# Patient Record
Sex: Male | Born: 1955 | Race: White | Hispanic: No | Marital: Single | Smoking: Former smoker
Health system: Southern US, Community
[De-identification: ages and names within clinical notes are randomized; demographics above are authoritative.]

## PROBLEM LIST (undated history)

## (undated) DIAGNOSIS — K4090 Unilateral inguinal hernia, without obstruction or gangrene, not specified as recurrent: Secondary | ICD-10-CM

## (undated) DIAGNOSIS — D696 Thrombocytopenia, unspecified: Secondary | ICD-10-CM

## (undated) DIAGNOSIS — Z8719 Personal history of other diseases of the digestive system: Secondary | ICD-10-CM

## (undated) DIAGNOSIS — F419 Anxiety disorder, unspecified: Secondary | ICD-10-CM

## (undated) DIAGNOSIS — N189 Chronic kidney disease, unspecified: Secondary | ICD-10-CM

## (undated) DIAGNOSIS — R740 Nonspecific elevation of levels of transaminase and lactic acid dehydrogenase [LDH]: Secondary | ICD-10-CM

## (undated) DIAGNOSIS — Z87898 Personal history of other specified conditions: Secondary | ICD-10-CM

## (undated) DIAGNOSIS — G709 Myoneural disorder, unspecified: Secondary | ICD-10-CM

## (undated) DIAGNOSIS — D649 Anemia, unspecified: Secondary | ICD-10-CM

## (undated) DIAGNOSIS — R160 Hepatomegaly, not elsewhere classified: Secondary | ICD-10-CM

## (undated) DIAGNOSIS — I509 Heart failure, unspecified: Secondary | ICD-10-CM

## (undated) DIAGNOSIS — I10 Essential (primary) hypertension: Secondary | ICD-10-CM

## (undated) DIAGNOSIS — A419 Sepsis, unspecified organism: Secondary | ICD-10-CM

## (undated) DIAGNOSIS — J449 Chronic obstructive pulmonary disease, unspecified: Secondary | ICD-10-CM

## (undated) DIAGNOSIS — F1020 Alcohol dependence, uncomplicated: Secondary | ICD-10-CM

## (undated) HISTORY — DX: Thrombocytopenia, unspecified: D69.6

## (undated) HISTORY — DX: Nonspecific elevation of levels of transaminase and lactic acid dehydrogenase (ldh): R74.0

## (undated) HISTORY — DX: Alcohol dependence, uncomplicated: F10.20

## (undated) HISTORY — DX: Unilateral inguinal hernia, without obstruction or gangrene, not specified as recurrent: K40.90

## (undated) HISTORY — PX: OTHER SURGICAL HISTORY: SHX169

## (undated) HISTORY — PX: TONSILLECTOMY: SUR1361

---
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
## 2017-07-08 ENCOUNTER — Ambulatory Visit (INDEPENDENT_AMBULATORY_CARE_PROVIDER_SITE_OTHER): Payer: BLUE CROSS/BLUE SHIELD | Admitting: Physician Assistant

## 2017-07-08 ENCOUNTER — Ambulatory Visit (INDEPENDENT_AMBULATORY_CARE_PROVIDER_SITE_OTHER): Payer: BLUE CROSS/BLUE SHIELD

## 2017-07-08 ENCOUNTER — Encounter: Payer: Self-pay | Admitting: Physician Assistant

## 2017-07-08 VITALS — BP 147/79 | HR 80 | Temp 97.3°F | Resp 16 | Ht 73.5 in | Wt 178.6 lb

## 2017-07-08 DIAGNOSIS — Z136 Encounter for screening for cardiovascular disorders: Secondary | ICD-10-CM

## 2017-07-08 DIAGNOSIS — F1020 Alcohol dependence, uncomplicated: Secondary | ICD-10-CM | POA: Diagnosis not present

## 2017-07-08 DIAGNOSIS — D696 Thrombocytopenia, unspecified: Secondary | ICD-10-CM

## 2017-07-08 DIAGNOSIS — Z0000 Encounter for general adult medical examination without abnormal findings: Secondary | ICD-10-CM | POA: Diagnosis not present

## 2017-07-08 DIAGNOSIS — R7989 Other specified abnormal findings of blood chemistry: Secondary | ICD-10-CM | POA: Diagnosis not present

## 2017-07-08 DIAGNOSIS — R945 Abnormal results of liver function studies: Secondary | ICD-10-CM

## 2017-07-08 NOTE — Patient Instructions (Addendum)
Come back in two weeks.      IF you received an x-ray today, you will receive an invoice from [facility]. Please contact [facility] at [phone] with questions or concerns regarding your invoice.   IF you received labwork today, you will receive an invoice from [place]. Please contact [facility] at [phone] with questions or concerns regarding your invoice.   Our billing staff will not be able to assist you with questions regarding bills from these companies.  You will be contacted with the lab results as soon as they are available. The fastest way to get your results is to activate your My Chart account. Instructions are located on the last page of this paperwork. If you have not heard from [place] regarding the results in 2 weeks, please contact this office.

## 2017-07-08 NOTE — Progress Notes (Signed)
07/23/2017 11:25 AM   DOB: [birth year] / MRN: [id number]  SUBJECTIVE:  Nicholas Cunningham is a 61 y.o. male presenting to establish care.  From [place] and recently moved here. Retired years ago.  Is in need of hernia surgery about 1 month ago. This is in his groin. He quit smoking 4 years ago after a 15 year history.  Has a history of HTN, dyslipidemia and last doses of medication about 1 month ago. Stopped due because his medication was moved from there normal place.  Likes to drink daily, all day and night, off and on for what sounds like his adult life. He does think his drinking is a problem at this time. Has fallen several times due to drinking. Also has no feeling in his hands and feet. He is showing some interest in quitting but has not taken any steps to stop.    Depression screen PHQ 2/9 07/22/2017  Decreased Interest 0  Down, Depressed, Hopeless 0  PHQ - 2 Score 0     He has No Known Allergies.   He  has no past medical history on file.    He  reports that he quit smoking about 5 years ago. He has never used smokeless tobacco. He reports that he drinks alcohol. He reports that he does not use drugs. He  has no sexual activity history on file. The patient  has no past surgical history on file.  His family history is not on file.  Review of Systems  Constitutional: Negative for chills, diaphoresis and fever.  Respiratory: Negative for cough, hemoptysis, sputum production, shortness of breath and wheezing.   Cardiovascular: Negative for chest pain, orthopnea and leg swelling.  Gastrointestinal: Negative for nausea.  Skin: Negative for rash.  Neurological: Negative for dizziness.    The problem list and medications were reviewed and updated by myself where necessary and exist elsewhere in the encounter.   OBJECTIVE:  BP (!) 147/79 (BP Location: Right Arm, Patient Position: Sitting, Cuff Size: Normal)   Pulse 80   Temp (!) 97.3 F (36.3 C) (Oral)   Resp 16   Ht 6' 1.5" (1.867 m)    Wt 178 lb 9.6 oz (81 kg)   SpO2 94%   BMI 23.24 kg/m   Physical Exam  Constitutional: He appears well-developed. He is active and cooperative.  Non-toxic appearance.  Cardiovascular: Normal rate, regular rhythm, S1 normal, S2 normal, normal heart sounds, intact distal pulses and normal pulses.  Exam reveals no gallop and no friction rub.   No murmur heard. Pulmonary/Chest: Effort normal. No stridor. No tachypnea. No respiratory distress. He has no wheezes. He has no rales.  Abdominal: He exhibits no distension.  Musculoskeletal: He exhibits no edema.  Neurological: He is alert.  Skin: Skin is warm and dry. He is not diaphoretic. No pallor.  Psychiatric: His affect is not labile and not inappropriate. His speech is delayed and slurred. His speech is not tangential. He is slowed and withdrawn. He is not actively hallucinating. Thought content is not paranoid and not delusional. Cognition and memory are impaired. He exhibits a depressed mood. He expresses no homicidal and no suicidal ideation. He expresses no suicidal plans and no homicidal plans. He exhibits normal recent memory and normal remote memory.  Vitals reviewed.    Lab Results  Component Value Date   WBC 4.5 07/08/2017   HGB 16.0 07/08/2017   HCT 46.3 07/08/2017   MCV 91 07/08/2017   PLT 118 (L) 07/08/2017  Lab Results  Component Value Date   CREATININE 0.82 07/22/2017   BUN 11 07/22/2017   NA 145 (H) 07/22/2017   K 3.5 07/22/2017   CL 97 07/22/2017   CO2 25 07/22/2017    Lab Results  Component Value Date   ALT 114 (H) 07/08/2017   AST 167 (H) 07/08/2017   ALKPHOS 61 07/08/2017   BILITOT 1.1 07/08/2017    No results found for: TSH  Lab Results  Component Value Date   HGBA1C 5.1 07/22/2017   Lab Results  Component Value Date   FOLATE 11.2 07/08/2017   Lab Results  Component Value Date   VITAMINB12 961 07/08/2017    ASSESSMENT AND PLAN:  Nicholas Cunningham was seen today for establish care.  Diagnoses and  all orders for this visit:  Alcoholism [facility]): Advised that he go to some meetings.  This is likely the driving factor in his deleterious health. Advised that he start back on his lasix and kdur.  Will check basic labs and see him back in two weeks.  -     Vitamin B12 -     Folate  Encounter for medical examination to establish care  Screening for cardiovascular condition -     CBC with Differential/Platelet -     Hepatic function panel -     CMP and Liver -     DG Chest 2 View; Future -     ECHOCARDIOGRAM COMPLETE; Future -     EKG 12-Lead  Elevated LFTs -     Hepatitis B surface antibody -     Hepatitis B surface antigen  Thrombocytopenia (HCC)    The patient is advised to call or return to clinic if he does not see an improvement in symptoms, or to seek the care of the closest emergency department if he worsens with the above plan.   Deliah Boston, MHS, PA-C Primary Care at [facility] 07/23/2017 11:25 AM

## 2017-07-09 LAB — CMP AND LIVER
ALBUMIN: 4.7 g/dL (ref 3.6–4.8)
ALT: 114 IU/L — AB (ref 0–44)
AST: 167 IU/L — AB (ref 0–40)
Alkaline Phosphatase: 61 IU/L (ref 39–117)
BUN: 12 mg/dL (ref 8–27)
Bilirubin Total: 1.1 mg/dL (ref 0.0–1.2)
Bilirubin, Direct: 0.36 mg/dL (ref 0.00–0.40)
CO2: 24 mmol/L (ref 20–29)
CREATININE: 0.93 mg/dL (ref 0.76–1.27)
Calcium: 9.6 mg/dL (ref 8.6–10.2)
Chloride: 97 mmol/L (ref 96–106)
GFR calc Af Amer: 101 mL/min/{1.73_m2} (ref >59)
GFR, EST NON AFRICAN AMERICAN: 88 mL/min/{1.73_m2} (ref >59)
GLUCOSE: 80 mg/dL (ref 65–99)
Potassium: 4.1 mmol/L (ref 3.5–5.2)
Sodium: 145 mmol/L — ABNORMAL HIGH (ref 134–144)
Total Protein: 7.8 g/dL (ref 6.0–8.5)

## 2017-07-09 LAB — CBC WITH DIFFERENTIAL/PLATELET
BASOS ABS: 0 10*3/uL (ref 0.0–0.2)
Basos: 0 %
EOS (ABSOLUTE): 0.1 10*3/uL (ref 0.0–0.4)
Eos: 1 %
Hematocrit: 46.3 % (ref 37.5–51.0)
Hemoglobin: 16 g/dL (ref 13.0–17.7)
IMMATURE GRANS (ABS): 0 10*3/uL (ref 0.0–0.1)
Immature Granulocytes: 0 %
LYMPHS: 32 %
Lymphocytes Absolute: 1.4 10*3/uL (ref 0.7–3.1)
MCH: 31.6 pg (ref 26.6–33.0)
MCHC: 34.6 g/dL (ref 31.5–35.7)
MCV: 91 fL (ref 79–97)
Monocytes Absolute: 0.2 10*3/uL (ref 0.1–0.9)
Monocytes: 5 %
NEUTROS ABS: 2.8 10*3/uL (ref 1.4–7.0)
Neutrophils: 62 %
PLATELETS: 118 10*3/uL — AB (ref 150–379)
RBC: 5.07 x10E6/uL (ref 4.14–5.80)
RDW: 15.1 % (ref 12.3–15.4)
WBC: 4.5 10*3/uL (ref 3.4–10.8)

## 2017-07-09 LAB — FOLATE: Folate: 11.2 ng/mL (ref >3.0)

## 2017-07-09 LAB — VITAMIN B12: Vitamin B-12: 961 pg/mL (ref 232–1245)

## 2017-07-10 NOTE — Progress Notes (Signed)
Please add on peripheral smear, hep B surface antigen and surface antibody. Deliah Boston, MS, PA-C 10:51 AM, 07/10/2017

## 2017-07-12 LAB — HEPATITIS B SURFACE ANTIGEN: Hepatitis B Surface Ag: NEGATIVE

## 2017-07-12 LAB — HEPATITIS B SURFACE ANTIBODY, QUANTITATIVE: Hepatitis B Surf Ab Quant: <3.1 m[IU]/mL — ABNORMAL LOW (ref >9.9)

## 2017-07-22 ENCOUNTER — Ambulatory Visit (INDEPENDENT_AMBULATORY_CARE_PROVIDER_SITE_OTHER): Payer: BLUE CROSS/BLUE SHIELD

## 2017-07-22 ENCOUNTER — Encounter: Payer: Self-pay | Admitting: Physician Assistant

## 2017-07-22 ENCOUNTER — Ambulatory Visit (INDEPENDENT_AMBULATORY_CARE_PROVIDER_SITE_OTHER): Payer: BLUE CROSS/BLUE SHIELD | Admitting: Physician Assistant

## 2017-07-22 VITALS — BP 142/70 | Resp 18 | Ht 73.5 in | Wt 179.8 lb

## 2017-07-22 DIAGNOSIS — K4090 Unilateral inguinal hernia, without obstruction or gangrene, not specified as recurrent: Secondary | ICD-10-CM | POA: Insufficient documentation

## 2017-07-22 DIAGNOSIS — R740 Nonspecific elevation of levels of transaminase and lactic acid dehydrogenase [LDH]: Secondary | ICD-10-CM | POA: Diagnosis not present

## 2017-07-22 DIAGNOSIS — R202 Paresthesia of skin: Secondary | ICD-10-CM

## 2017-07-22 DIAGNOSIS — F1020 Alcohol dependence, uncomplicated: Secondary | ICD-10-CM

## 2017-07-22 DIAGNOSIS — R7401 Elevation of levels of liver transaminase levels: Secondary | ICD-10-CM

## 2017-07-22 DIAGNOSIS — I509 Heart failure, unspecified: Secondary | ICD-10-CM | POA: Diagnosis not present

## 2017-07-22 HISTORY — DX: Elevation of levels of liver transaminase levels: R74.01

## 2017-07-22 HISTORY — DX: Unilateral inguinal hernia, without obstruction or gangrene, not specified as recurrent: K40.90

## 2017-07-22 HISTORY — DX: Alcohol dependence, uncomplicated: F10.20

## 2017-07-22 LAB — POCT GLYCOSYLATED HEMOGLOBIN (HGB A1C): Hemoglobin A1C: 5.1

## 2017-07-22 NOTE — Progress Notes (Signed)
07/23/2017 9:44 AM   DOB: 12-23-1955 / MRN: [id number]  SUBJECTIVE:  Nicholas Cunningham is a 61 y.o. male presenting for recheck. He refuses to quit drinking today and is not really interested in treatment today.  He has not been to any meetings.   Wants to talk about a pinched nerve in his neck and back.  Has not tried any medication for this.  Tell me this has been present for quite a long time now.  With the neck being longer.  He does not know when this started. Tells me that his neck does not hurt, nor does his back.  He can not however feel his hands or feet.  He tells me that he falls because he can't feel his feet. Thinks he has fallen 6 times in the last year, and he always falls to the left.   Has an inguinal hernia. Not sure how long this has been there but was told that he needs surgery to fix this.  Denies tenderness and fever today.    He has No Known Allergies.   He  has no past medical history on file.    He  reports that he quit smoking about 5 years ago. He has never used smokeless tobacco. He reports that he drinks alcohol. He reports that he does not use drugs. He  has no sexual activity history on file. The patient  has no past surgical history on file.  His family history is not on file.  Review of Systems  Constitutional: Negative for chills and fever.  Cardiovascular: Negative for chest pain.  Neurological: Negative for dizziness, focal weakness and headaches.    The problem list and medications were reviewed and updated by myself where necessary and exist elsewhere in the encounter.   OBJECTIVE:  BP (!) 142/70 (BP Location: Left Arm, Patient Position: Sitting, Cuff Size: Normal)   Resp 18   Ht 6' 1.5" (1.867 m)   Wt 179 lb 12.8 oz (81.6 kg)   BMI 23.40 kg/m   Wt Readings from Last 3 Encounters:  07/22/17 179 lb 12.8 oz (81.6 kg)  07/08/17 178 lb 9.6 oz (81 kg)     BP Readings from Last 3 Encounters:  07/22/17 (!) 142/70  07/08/17 (!) 147/79   Lab Results   Component Value Date   WBC 4.5 07/08/2017   HGB 16.0 07/08/2017   HCT 46.3 07/08/2017   MCV 91 07/08/2017   PLT 118 (L) 07/08/2017    Lab Results  Component Value Date   CREATININE 0.82 07/22/2017   BUN 11 07/22/2017   NA 145 (H) 07/22/2017   K 3.5 07/22/2017   CL 97 07/22/2017   CO2 25 07/22/2017    Lab Results  Component Value Date   ALT 114 (H) 07/08/2017   AST 167 (H) 07/08/2017   ALKPHOS 61 07/08/2017   BILITOT 1.1 07/08/2017    No results found for: TSH  Lab Results  Component Value Date   HGBA1C 5.1 07/22/2017    No results found for: CHOL, HDL, LDLCALC, LDLDIRECT, TRIG, CHOLHDL  Physical Exam  Constitutional: He is oriented to person, place, and time.  Cardiovascular: Normal rate and regular rhythm.   Pulmonary/Chest: Effort normal and breath sounds normal.  Abdominal: Soft. Bowel sounds are normal. He exhibits no distension and no mass. There is no tenderness. There is no rebound and no guarding.  Genitourinary:     Musculoskeletal: Normal range of motion.  Neurological: He is alert and  oriented to person, place, and time. He has normal reflexes. He displays normal reflexes. No cranial nerve deficit. He exhibits normal muscle tone. Coordination normal.  Skin: Skin is warm and dry.    Results for orders placed or performed in visit on 07/22/17 (from the past 72 hour(s))  POCT glycosylated hemoglobin (Hb A1C)     Status: None   Collection Time: 07/22/17  3:01 PM  Result Value Ref Range   Hemoglobin A1C 5.1   Basic Metabolic Panel     Status: Abnormal   Collection Time: 07/22/17  3:15 PM  Result Value Ref Range   Glucose 92 65 - 99 mg/dL   BUN 11 8 - 27 mg/dL   Creatinine, Ser 1.61 0.76 - 1.27 mg/dL   GFR calc non Af Amer 94 >59 mL/min/1.73   GFR calc Af Amer 109 >59 mL/min/1.73   BUN/Creatinine Ratio 13 10 - 24   Sodium 145 (H) 134 - 144 mmol/L   Potassium 3.5 3.5 - 5.2 mmol/L   Chloride 97 96 - 106 mmol/L   CO2 25 20 - 29 mmol/L   Calcium  9.0 8.6 - 10.2 mg/dL    Dg Cervical Spine 2 Or 3 Views  Result Date: 07/22/2017 CLINICAL DATA:  Inability to feel he.  Inch nerve. EXAM: CERVICAL SPINE - 2-3 VIEW COMPARISON:  None. FINDINGS: There is straightening of the normally lordotic cervical spine through C7. T1 is obscured. No vertebral compression deformity. There is moderate narrowing of the C3-4 and C4-5 discs. There is moderate narrowing of the C6-7 disc. Unremarkable prevertebral soft tissues. Odontoid is intact. IMPRESSION: No acute bony pathology.  Degenerative changes.  Limited exam. Electronically Signed   By: Jolaine Click M.D.   On: 07/22/2017 15:07   Dg Lumbar Spine Complete  Result Date: 07/22/2017 CLINICAL DATA:  Paresthesias. EXAM: LUMBAR SPINE - COMPLETE 4+ VIEW COMPARISON:  None. FINDINGS: There is no evidence of lumbar spine fracture. Alignment is normal. Intervertebral disc spaces are maintained. Calcification of the abdominal aorta. IMPRESSION: Negative lumbar spine. Aortic atherosclerosis. Electronically Signed   By: Francene Boyers M.D.   On: 07/22/2017 15:12    ASSESSMENT AND PLAN:  Thurmon was seen today for follow-up.  Diagnoses and all orders for this visit:  Right inguinal hernia: Very large right sided hernia.  Fortunately it is not tender today and is a chronic problem. Sadly this may be his best opportunity to quit drinking given this will require some inpatient care.   He does have a history of CHF and would like to have both his liver ultrasound and cardiac echo before proceeding with surgery.   Paresthesia: The numbness in his hands and feet is most likely secondary to alcoholism and NOT DJD.  B12 is normal however he has no vibratory sense in his feet.  I continue to strongly encourage him to quit drinking, however sadly he is not ready.  -     DG Lumbar Spine Complete; Future -     DG Cervical Spine 2 or 3 views; Future  Congestive heart failure, unspecified HF chronicity, unspecified heart failure type  [facility]): Patient on and off his lasix at this time.  I have advised he continue and try not to miss doses. Echo pending.  -     Basic Metabolic Panel  Transaminitis: Will evaluate.  Family has questions regarding surgery and his ability to undergo surgery with regard to his liver disease.   -     US Abdomen Limited RUQ;  Future  The patient is advised to call or return to clinic if he does not see an improvement in symptoms, or to seek the care of the closest emergency department if he worsens with the above plan.   Deliah Boston, MHS, PA-C Primary Care at [facility] 07/23/2017 9:44 AM

## 2017-07-22 NOTE — Patient Instructions (Signed)
     IF you received an x-ray today, you will receive an invoice from [facility]. Please contact [facility] at [phone] with questions or concerns regarding your invoice.   IF you received labwork today, you will receive an invoice from [facility]. Please contact [facility] at [phone] with questions or concerns regarding your invoice.   Our billing staff will not be able to assist you with questions regarding bills from these companies.  You will be contacted with the lab results as soon as they are available. The fastest way to get your results is to activate your My Chart account. Instructions are located on the last page of this paperwork. If you have not heard from us regarding the results in 2 weeks, please contact this office.     

## 2017-07-23 DIAGNOSIS — D696 Thrombocytopenia, unspecified: Secondary | ICD-10-CM | POA: Insufficient documentation

## 2017-07-23 HISTORY — DX: Thrombocytopenia, unspecified: D69.6

## 2017-07-23 LAB — BASIC METABOLIC PANEL
BUN/Creatinine Ratio: 13 (ref 10–24)
BUN: 11 mg/dL (ref 8–27)
CALCIUM: 9 mg/dL (ref 8.6–10.2)
CHLORIDE: 97 mmol/L (ref 96–106)
CO2: 25 mmol/L (ref 20–29)
Creatinine, Ser: 0.82 mg/dL (ref 0.76–1.27)
GFR calc non Af Amer: 94 mL/min/{1.73_m2} (ref >59)
GFR, EST AFRICAN AMERICAN: 109 mL/min/{1.73_m2} (ref >59)
GLUCOSE: 92 mg/dL (ref 65–99)
POTASSIUM: 3.5 mmol/L (ref 3.5–5.2)
Sodium: 145 mmol/L — ABNORMAL HIGH (ref 134–144)

## 2017-07-26 ENCOUNTER — Ambulatory Visit
Admission: RE | Admit: 2017-07-26 | Discharge: 2017-07-26 | Disposition: A | Discharge status: Home / Self Care | Payer: BLUE CROSS/BLUE SHIELD | Source: Ambulatory Visit | Attending: Physician Assistant | Admitting: Physician Assistant

## 2017-07-26 DIAGNOSIS — R7401 Elevation of levels of liver transaminase levels: Secondary | ICD-10-CM

## 2017-07-26 DIAGNOSIS — Z136 Encounter for screening for cardiovascular disorders: Secondary | ICD-10-CM

## 2017-07-26 DIAGNOSIS — R740 Nonspecific elevation of levels of transaminase and lactic acid dehydrogenase [LDH]: Secondary | ICD-10-CM | POA: Insufficient documentation

## 2017-07-26 NOTE — Progress Notes (Signed)
*  PRELIMINARY RESULTS* Echocardiogram 2D Echocardiogram has been performed.  Nicholas Cunningham 07/26/2017, 10:05 AM

## 2017-07-28 ENCOUNTER — Other Ambulatory Visit: Payer: Self-pay

## 2017-08-03 ENCOUNTER — Telehealth: Payer: Self-pay

## 2017-08-03 NOTE — Telephone Encounter (Signed)
Have spoke to patient and significant other regarding results. Patient verbalized understanding to all. Patient requesting referral to surgeon for hernia repair, forwarded to provider at this time./ S.Michal Callicott,CMA

## 2017-08-03 NOTE — Telephone Encounter (Signed)
Nicholas Cunningham can we please check with [facility] to see if they have contacted the patient? If they have tried already would you please have him call them to get scheduled?  Thanks for you help.

## 2017-08-03 NOTE — Telephone Encounter (Signed)
-----   Message from Ofilia NeasMichael L Clark, PA-C sent at 07/28/2017 12:27 PM EDT ----- Please call. No heart failure.  Hepatic disease on US 2/2 to alcoholism. Advise that he continue his medications as they are right now.  I'd like to see him back in about 60 days.  He should consult surgery for his hernia and at this point I don't see why he can't proceed with hernia repair. Deliah BostonMichael Clark, MS, PA-C 12:26 PM, 07/28/2017 '

## 2017-08-04 ENCOUNTER — Telehealth: Payer: Self-pay | Admitting: Physician Assistant

## 2017-08-04 NOTE — Telephone Encounter (Signed)
[name] called back and I was able to get verbal consent from patient Nicholas Cunningham to speak with her concerning his referral. She said they called [facility] last week and they had not received anything. We did not send this referral until 08/01/17. I told her to call them again to see if they have it and to set up an appt and I would also re send the referral just in case so they would have it today. I told her to call me if there were any issues. Thanks!

## 2017-08-04 NOTE — Telephone Encounter (Signed)
Thanks Pattricia BossAnnie. Deliah BostonMichael Paislyn Domenico, MS, PA-C 3:52 PM, 08/04/2017

## 2017-08-04 NOTE — Telephone Encounter (Signed)
Nicholas Cunningham called stated that she wanted to know who we sent pt referral to and some info about pt referral.. [name] was not on pt DPR and I let her know that and I let her know I couldn't release any of pt information to her. She stated that [name] knew her and knew about the situation but I still let pt know I could release any info to her that her name and information had to be on his DPR list I told her I could talk to him but not her. She stated she would get him to call back.

## 2017-08-11 ENCOUNTER — Other Ambulatory Visit: Payer: Self-pay | Admitting: Surgery

## 2017-08-11 DIAGNOSIS — K4090 Unilateral inguinal hernia, without obstruction or gangrene, not specified as recurrent: Secondary | ICD-10-CM

## 2017-08-12 ENCOUNTER — Other Ambulatory Visit: Payer: Self-pay | Admitting: Ophthalmology

## 2017-08-12 DIAGNOSIS — H547 Unspecified visual loss: Secondary | ICD-10-CM

## 2017-08-18 ENCOUNTER — Ambulatory Visit
Admission: RE | Admit: 2017-08-18 | Discharge: 2017-08-18 | Disposition: A | Discharge status: Home / Self Care | Payer: BLUE CROSS/BLUE SHIELD | Source: Ambulatory Visit | Attending: Ophthalmology | Admitting: Ophthalmology

## 2017-08-18 DIAGNOSIS — J341 Cyst and mucocele of nose and nasal sinus: Secondary | ICD-10-CM | POA: Diagnosis not present

## 2017-08-18 DIAGNOSIS — H547 Unspecified visual loss: Secondary | ICD-10-CM | POA: Insufficient documentation

## 2017-08-18 DIAGNOSIS — M5021 Other cervical disc displacement,  high cervical region: Secondary | ICD-10-CM | POA: Insufficient documentation

## 2017-08-18 MED ORDER — GADOBENATE DIMEGLUMINE 529 MG/ML IV SOLN
16.0000 mL | Freq: Once | INTRAVENOUS | Status: AC | PRN
  Administered 2017-08-18: 16 mL via INTRAVENOUS

## 2017-08-24 ENCOUNTER — Ambulatory Visit
Admission: RE | Admit: 2017-08-24 | Discharge: 2017-08-24 | Disposition: A | Discharge status: Home / Self Care | Payer: BLUE CROSS/BLUE SHIELD | Source: Ambulatory Visit | Attending: Surgery | Admitting: Surgery

## 2017-08-24 DIAGNOSIS — N1330 Unspecified hydronephrosis: Secondary | ICD-10-CM | POA: Diagnosis not present

## 2017-08-24 DIAGNOSIS — I700 Atherosclerosis of aorta: Secondary | ICD-10-CM | POA: Insufficient documentation

## 2017-08-24 DIAGNOSIS — K4090 Unilateral inguinal hernia, without obstruction or gangrene, not specified as recurrent: Secondary | ICD-10-CM | POA: Insufficient documentation

## 2017-08-24 DIAGNOSIS — N3289 Other specified disorders of bladder: Secondary | ICD-10-CM | POA: Insufficient documentation

## 2017-08-24 HISTORY — DX: Heart failure, unspecified: I50.9

## 2017-08-24 MED ORDER — IOPAMIDOL (ISOVUE-300) INJECTION 61%
100.0000 mL | Freq: Once | INTRAVENOUS | Status: AC | PRN
  Administered 2017-08-24: 100 mL via INTRAVENOUS

## 2017-09-07 ENCOUNTER — Ambulatory Visit: Payer: Self-pay | Admitting: Urology

## 2017-09-23 ENCOUNTER — Ambulatory Visit: Payer: Self-pay | Admitting: Surgery
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
## 2017-09-28 ENCOUNTER — Telehealth: Payer: Self-pay | Admitting: Physician Assistant

## 2017-09-28 NOTE — Telephone Encounter (Signed)
Called [facility] to see if pt needs another referral in and in order for pt to be seen there again and to have surgery he needs another referral put in the one that was put in back on 07/22/17 is expired and pt had to self pay on dec 7 when he went to see [facility].. So pt does need another referral in for [facility].   Please advise

## 2017-09-28 NOTE — Telephone Encounter (Signed)
Copied from CRM [phone]. Topic: Referral - Request >> Sep 27, 2017 12:44 PM Diana EvesHoyt, Maryann B wrote: Reason for CRM: pt needing a referral for hernia surgery with Dr. Doylene Canardonner. [employer] company is needing the referral..   Please advise

## 2017-09-28 NOTE — Telephone Encounter (Signed)
This has already been placed  

## 2017-09-30 ENCOUNTER — Telehealth: Payer: Self-pay | Admitting: Physician Assistant

## 2017-09-30 NOTE — Telephone Encounter (Signed)
Copied from CRM [id number]. Topic: Quick Communication - Rx Refill/Question >> Sep 30, 2017  3:40 PM Darletta MollLander, Lumin L wrote: Has the patient contacted their pharmacy? Yes.   (Agent: If no, request that the patient contact the pharmacy for the refill.) Preferred Pharmacy (with phone number or street name): [facility] [phone] - [place],  - [address] [facility] [address] Agent: Please be advised that RX refills may take up to 3 business days. We ask that you follow-up with your pharmacy. Refill furosemide (LASIX) 40 MG tablet

## 2017-10-03 ENCOUNTER — Other Ambulatory Visit: Payer: Self-pay | Admitting: *Deleted

## 2017-10-03 ENCOUNTER — Other Ambulatory Visit: Payer: Self-pay | Admitting: Physician Assistant

## 2017-10-03 DIAGNOSIS — K4091 Unilateral inguinal hernia, without obstruction or gangrene, recurrent: Secondary | ICD-10-CM

## 2017-10-03 MED ORDER — FUROSEMIDE 40 MG PO TABS: 40.0000 mg | ORAL_TABLET | Freq: Two times a day (BID) | ORAL | 30 refills | Status: DC

## 2017-10-03 MED ORDER — FUROSEMIDE 40 MG PO TABS: 40.0000 mg | ORAL_TABLET | Freq: Two times a day (BID) | ORAL | 0 refills | Status: DC

## 2017-10-14 ENCOUNTER — Encounter: Payer: Self-pay | Admitting: Urology

## 2017-10-14 ENCOUNTER — Ambulatory Visit (INDEPENDENT_AMBULATORY_CARE_PROVIDER_SITE_OTHER): Payer: BLUE CROSS/BLUE SHIELD | Admitting: Urology

## 2017-10-14 VITALS — BP 158/83 | HR 78 | Ht 74.0 in

## 2017-10-14 DIAGNOSIS — N401 Enlarged prostate with lower urinary tract symptoms: Secondary | ICD-10-CM | POA: Insufficient documentation

## 2017-10-14 DIAGNOSIS — N400 Enlarged prostate without lower urinary tract symptoms: Secondary | ICD-10-CM | POA: Diagnosis not present

## 2017-10-14 DIAGNOSIS — R338 Other retention of urine: Secondary | ICD-10-CM

## 2017-10-14 DIAGNOSIS — N1330 Unspecified hydronephrosis: Secondary | ICD-10-CM | POA: Insufficient documentation

## 2017-10-14 LAB — URINALYSIS, COMPLETE
BILIRUBIN UA: NEGATIVE
Glucose, UA: NEGATIVE
KETONES UA: NEGATIVE
NITRITE UA: POSITIVE — AB
Protein, UA: NEGATIVE
RBC UA: NEGATIVE
SPEC GRAV UA: 1.01 (ref 1.005–1.030)
UUROB: 0.2 mg/dL (ref 0.2–1.0)
pH, UA: 5 (ref 5.0–7.5)

## 2017-10-14 LAB — MICROSCOPIC EXAMINATION
Epithelial Cells (non renal): NONE SEEN /hpf (ref 0–10)
RBC MICROSCOPIC, UA: NONE SEEN /HPF (ref 0–?)

## 2017-10-14 LAB — BLADDER SCAN AMB NON-IMAGING

## 2017-10-14 NOTE — Progress Notes (Signed)
10/14/2017 10:10 AM   Nicholas DuverneyPaul Bastidas [birth year] [id number]  Referring provider: Ofilia Neaslark, Michael L, PA-C [address] [place], [place] [address]  Chief Complaint  Patient presents with  . Benign Prostatic Hypertrophy    New Patient    HPI: Nicholas Cunningham is a 61 year old male seen in consultation at the request of Dr. Doylene Canardonner for evaluation of bilateral hydronephrosis.  He was seen and October 2018 for a large right inguinal hernia.  A CT scan obtained on 08/24/2017 showed bilateral hydronephrosis with hydroureter down to the bladder and bladder distention.  He does have bothersome lower urinary tract symptoms including weak urinary stream, urgency, intermittent urinary stream and nocturia x2.  IPSS completed today was 19/35 with a qol rated 5/6.  He states [place] was [place] in [place] in 2017 for septicemia secondary to "infected cysts".  He apparently had urinary retention and was discharged with an indwelling Foley catheter.  He saw a [place]rologist in [place] but does not remember the extent of his evaluation.  He states he had an indwelling Foley catheter for at least 2 months and then was performing clean intermittent catheterization.  He moved to this area in August 2018 and states he has not catheterized since that time.  He he denies gross hematuria.   PMH: Past Medical History:  Diagnosis Date  . Alcoholism (HCC) 07/22/2017  . CHF (congestive heart failure) (HCC)   . Right inguinal hernia 07/22/2017  . Thrombocytopenia (HCC) 07/23/2017  . Transaminitis 07/22/2017    Surgical History: Past Surgical History:  Procedure Laterality Date  . HERNIA REPAIR  1955    Home Medications:  Allergies as of 10/14/2017   No Known Allergies     Medication List        Accurate as of 10/14/17 11:59 PM. Always use your most recent med list.          folic acid 1 MG tablet Commonly known as:  FOLVITE TK 1 T PO D   furosemide 40 MG tablet Commonly known as:  LASIX Take 1 tablet (40 mg  total) by mouth 2 (two) times daily.   potassium chloride 8 MEQ tablet Commonly known as:  KLOR-CON Take 8 mEq by mouth 2 (two) times daily.       Allergies: No Known Allergies  Family History: Family History  Problem Relation Age of Onset  . Prostate cancer Neg Hx   . Bladder Cancer Neg Hx   . Kidney cancer Neg Hx     Social History:  reports that he quit smoking about 6 years ago. he has never used smokeless tobacco. He reports that he drinks alcohol. He reports that he does not use drugs.  ROS: UROLOGY Frequent Urination?: Yes Hard to postpone urination?: Yes Burning/pain with urination?: No Get up at night to urinate?: Yes Leakage of urine?: Yes Urine stream starts and stops?: Yes Trouble starting stream?: No Do you have to strain to urinate?: No Blood in urine?: No Urinary tract infection?: No Sexually transmitted disease?: No Injury to kidneys or bladder?: No Painful intercourse?: No Weak stream?: No Erection problems?: Yes Penile pain?: No  Gastrointestinal Nausea?: No Vomiting?: No Indigestion/heartburn?: No Diarrhea?: No Constipation?: No  Constitutional Fever: No Night sweats?: No Weight loss?: No Fatigue?: No  Skin Skin rash/lesions?: Yes Itching?: Yes  Eyes Blurred vision?: Yes Double vision?: No  Ears/Nose/Throat Sore throat?: Yes Sinus problems?: Yes  Hematologic/Lymphatic Swollen glands?: No Easy bruising?: Yes  Cardiovascular Leg swelling?: No Chest pain?: No  Respiratory  Cough?: Yes Shortness of breath?: Yes  Endocrine Excessive thirst?: No  Musculoskeletal Back pain?: Yes Joint pain?: Yes  Neurological Headaches?: No Dizziness?: No  Psychologic Depression?: Yes Anxiety?: No  Physical Exam: BP (!) 158/83   Pulse 78   Ht 6\' 2"  (1.88 m)   BMI 23.08 kg/m   Constitutional:  Alert and oriented, No acute distress. HEENT:  AT, moist mucus membranes.  Trachea midline, no masses. Cardiovascular: No clubbing,  cyanosis, or edema. Respiratory: Normal respiratory effort, no increased work of breathing. GI: Abdomen is soft, nontender, nondistended, no abdominal masses GU: No CVA tenderness.  Prostate 60 g, smooth without nodules Skin: No rashes, bruises or suspicious lesions. Lymph: No cervical or inguinal adenopathy. Neurologic: Grossly intact, no focal deficits, moving all 4 extremities. Psychiatric: Normal mood and affect.  Laboratory Data: Lab Results  Component Value Date   WBC 4.5 07/08/2017   HGB 16.0 07/08/2017   HCT 46.3 07/08/2017   MCV 91 07/08/2017   PLT 118 (L) 07/08/2017    Lab Results  Component Value Date   CREATININE 0.82 07/22/2017    Lab Results  Component Value Date   HGBA1C 5.1 07/22/2017    Urinalysis Lab Results  Component Value Date   SPECGRAV 1.010 10/14/2017   PHUR 5.0 10/14/2017   COLORU Yellow 10/14/2017   APPEARANCEUR CLOUDY (A) 10/15/2017   LEUKOCYTESUR TRACE (A) 10/15/2017   PROTEINUR >=300 (A) 10/15/2017   GLUCOSEU 50 (A) 10/15/2017   KETONESU Negative 10/14/2017   RBCU TOO NUMEROUS TO COUNT 10/15/2017   BILIRUBINUR NEGATIVE 10/15/2017   UUROB 0.2 10/14/2017   NITRITE NEGATIVE 10/15/2017    Lab Results  Component Value Date   LABMICR See below: 10/14/2017   WBCUA 11-30 (A) 10/14/2017   RBCUA None seen 10/14/2017   LABEPIT None seen 10/14/2017   MUCUS Present (A) 10/14/2017   BACTERIA MANY (A) 10/15/2017     Assessment & Plan:    1. Benign prostatic hyperplasia with urinary retention PVR by bladder scan was 277 mL.  I recommended Foley catheter placement and 500 mL of urine was obtained.  His urinalysis did show pyuria and was sent for culture.  - Urinalysis, Complete - BLADDER SCAN AMB NON-IMAGING - CULTURE, URINE COMPREHENSIVE  2. Hydronephrosis, unspecified hydronephrosis type Will obtain a renal ultrasound approximately 7 days of catheter drainage.  Discontinue his Foley catheter at that time and placed back on intermittent  catheterization until his evaluation is complete.  - Ultrasound renal complete; Future    Riki AltesScott C Imaad Reuss, MD  [facility] [address], [address] [place], [place] [address] [phone]

## 2017-10-14 NOTE — Progress Notes (Signed)
Simple Catheter Placement  Due to urinary retention patient is present today for a foley cath placement.  Patient was cleaned and prepped in a sterile fashion with betadine and lidocaine jelly 2% was instilled into the urethra.  A 16 FR Coude foley catheter was inserted, urine return was noted  500ml, urine was yellow in color.  The balloon was filled with 10cc of sterile water.  A leg bag was attached for drainage. Patient was also given a night bag to take home and was given instruction on how to change from one bag to another.  Patient was given instruction on proper catheter care.  Patient tolerated well, no complications were noted   Preformed by: C. Rana SnareLowe, CMA

## 2017-10-15 ENCOUNTER — Emergency Department
Admission: EM | Admit: 2017-10-15 | Discharge: 2017-10-15 | Disposition: A | Discharge status: Home / Self Care | Payer: BLUE CROSS/BLUE SHIELD | Attending: Emergency Medicine | Admitting: Emergency Medicine

## 2017-10-15 DIAGNOSIS — R310 Gross hematuria: Secondary | ICD-10-CM | POA: Insufficient documentation

## 2017-10-15 DIAGNOSIS — Z79899 Other long term (current) drug therapy: Secondary | ICD-10-CM | POA: Insufficient documentation

## 2017-10-15 DIAGNOSIS — I509 Heart failure, unspecified: Secondary | ICD-10-CM | POA: Insufficient documentation

## 2017-10-15 DIAGNOSIS — Z87891 Personal history of nicotine dependence: Secondary | ICD-10-CM | POA: Insufficient documentation

## 2017-10-15 DIAGNOSIS — R319 Hematuria, unspecified: Secondary | ICD-10-CM | POA: Diagnosis present

## 2017-10-15 LAB — URINALYSIS, COMPLETE (UACMP) WITH MICROSCOPIC
Bilirubin Urine: NEGATIVE
Glucose, UA: 50 mg/dL — AB
Ketones, ur: NEGATIVE mg/dL
Nitrite: NEGATIVE
Specific Gravity, Urine: 1.024 (ref 1.005–1.030)
Squamous Epithelial / LPF: NONE SEEN
pH: 7 (ref 5.0–8.0)

## 2017-10-15 MED ORDER — CEPHALEXIN 500 MG PO CAPS
500.0000 mg | ORAL_CAPSULE | Freq: Once | ORAL | Status: AC
  Administered 2017-10-15: 500 mg via ORAL
  Filled 2017-10-15: qty 1

## 2017-10-15 MED ORDER — CEPHALEXIN 500 MG PO CAPS
500.0000 mg | ORAL_CAPSULE | Freq: Two times a day (BID) | ORAL | 0 refills | Status: AC
Start: 2017-10-15 — End: 2017-10-22

## 2017-10-15 NOTE — ED Triage Notes (Signed)
Patient c/o hematuria - blood seen in catheter drainage bag. Catheter placed today in urology due to urinary retention. Patient c/o penile pain described as pressure.

## 2017-10-15 NOTE — Discharge Instructions (Signed)
Take the antibiotic as prescribed and finish the full course.  You should follow-up with your urologist as scheduled.  Return to the emergency department immediately for new, worsening, or persistent bleeding, swelling in the lower abdomen or retention of urine, other malfunctions of the catheter, fever or chills, flank or back pain, or any other new or worsening symptoms that concern you.

## 2017-10-15 NOTE — ED Notes (Signed)
Pt states is here for blood in his urinary catheter. Pt states began at 1800.

## 2017-10-15 NOTE — ED Provider Notes (Signed)
[facility] Emergency Department Provider Note ____________________________________________   First MD Initiated Contact with Patient 10/15/17 [phone]     (approximate)  I have reviewed the triage vital signs and the nursing notes.   HISTORY  Chief Complaint Hematuria    HPI Nicholas Cunningham is a 61 y.o. male with a history of BPH and urinary retention who presents with hematuria, acute onset today, occurring after he had a catheter change today, and now starting to clear.  Patient reports some feeling of discomfort in his suprapubic area and fullness, but no abdominal pain or back or flank pain.  Patient denies fever or chills.  The patient states that he had a Foley in for several weeks, and it was changed today.  He most recently had a UTI approximately 1 year ago.  Prior to having the Foley over the last few weeks, he was self catheterizing.   Past Medical History:  Diagnosis Date  . Alcoholism (HCC) 07/22/2017  . CHF (congestive heart failure) (HCC)   . Right inguinal hernia 07/22/2017  . Thrombocytopenia (HCC) 07/23/2017  . Transaminitis 07/22/2017    Patient Active Problem List   Diagnosis Date Noted  . Benign prostatic hyperplasia with urinary retention 10/14/2017  . Hydronephrosis 10/14/2017  . Thrombocytopenia (HCC) 07/23/2017  . Alcoholism (HCC) 07/22/2017  . Right inguinal hernia 07/22/2017  . Congestive heart failure (HCC) 07/22/2017  . Transaminitis 07/22/2017     Prior to Admission medications   Medication Sig Start Date End Date Taking? Authorizing Provider  folic acid (FOLVITE) 1 MG tablet Take 1 mg by mouth daily.   Yes [provider]  furosemide (LASIX) 40 MG tablet Take 1 tablet (40 mg total) by mouth 2 (two) times daily. 10/03/17  Yes Ofilia Neaslark, Michael L, PA-C  potassium chloride (KLOR-CON) 8 MEQ tablet Take 8 mEq by mouth 2 (two) times daily.   Yes [provider]  cephALEXin (KEFLEX) 500 MG capsule Take 1 capsule (500  mg total) by mouth 2 (two) times daily for 7 days. 10/15/17 10/22/17  Dionne BucySiadecki, Chace Bisch, MD    Allergies Patient has no known allergies.  Family History  Problem Relation Age of Onset  . Prostate cancer Neg Hx   . Bladder Cancer Neg Hx   . Kidney cancer Neg Hx     Social History Social History   Tobacco Use  . Smoking status: Former Smoker    Last attempt to quit: 2013    Years since quitting: 5.9  . Smokeless tobacco: Never Used  Substance Use Topics  . Alcohol use: Yes    Comment: patient suffers with alcoholism  . Drug use: No    Review of Systems  Constitutional: No fever/chills. Eyes: No redness. ENT: No neck pain. Cardiovascular: Denies chest pain. Respiratory: Denies shortness of breath. Gastrointestinal: No abdominal pain. Genitourinary: Positive for hematuria.  Musculoskeletal: Negative for acute back pain. Skin: Negative for rash. Neurological: Negative for headache.   ____________________________________________   PHYSICAL EXAM:  VITAL SIGNS: ED Triage Vitals [10/15/17 0032]  Enc Vitals Group     BP 136/88     Pulse Rate 90     Resp 19     Temp 98.8 F (37.1 C)     Temp Source Oral     SpO2 98 %     Weight 180 lb (81.6 kg)     Height 6\' 2"  (1.88 m)     Head Circumference      Peak Flow  Pain Score 4     Pain Loc      Pain Edu?      Excl. in GC?     Constitutional: Alert and oriented. Well appearing and in no acute distress. Eyes: Conjunctivae are normal.  Head: Atraumatic. Nose: No congestion/rhinnorhea. Mouth/Throat: Mucous membranes are moist.   Neck: Normal range of motion.  Cardiovascular:  Good peripheral circulation. Respiratory: Normal respiratory effort.  No retractions. Gastrointestinal: Soft and nontender. No distention.  Genitourinary: No CVA tenderness.  No suprapubic tenderness or fullness.  Penile meatus appears normal.  Scrotum and inguinal area chronically swollen due to hernia. Musculoskeletal: No lower  extremity edema.  Extremities warm and well perfused.  Neurologic:  Normal speech and language. No gross focal neurologic deficits are appreciated.  Skin:  Skin is warm and dry. No rash noted. Psychiatric: Mood and affect are normal. Speech and behavior are normal.  ____________________________________________   LABS (all labs ordered are listed, but only abnormal results are displayed)  Labs Reviewed  URINALYSIS, COMPLETE (UACMP) WITH MICROSCOPIC - Abnormal; Notable for the following components:      Result Value   Color, Urine AMBER (*)    APPearance CLOUDY (*)    Glucose, UA 50 (*)    Hgb urine dipstick MODERATE (*)    Protein, ur >=300 (*)    Leukocytes, UA TRACE (*)    Bacteria, UA MANY (*)    All other components within normal limits   ____________________________________________  EKG   ____________________________________________  RADIOLOGY    ____________________________________________   PROCEDURES  Procedure(s) performed: No    Critical Care performed: No ____________________________________________   INITIAL IMPRESSION / ASSESSMENT AND PLAN / ED COURSE  Pertinent labs & imaging results that were available during my care of the patient were reviewed by me and considered in my medical decision making (see chart for details).  61 year old male with history of BPH and recent Foley for several weeks status post Foley change today presents with hematuria since this evening, which now appears to be slightly clearing up.  Patient reports some suprapubic fullness but no pain, and no fever.  Review of past medical records in Epic confirms history of BPH and Foley change today.  On exam, vital signs are normal, the patient is well-appearing, and the remainder the exam is as described above.  Differential for the hematuria includes irritation of the bladder due to the new Foley, versus possible acute infection.  We will obtain a bladder scan to evaluate for any  urinary retention; if it is present we may need to change the catheter again.  Patient's UA shows WBCs and RBCs; although hematuria will cause this, I cannot rule out UTI.  If no urinary retention we will discharge with antibiotic for UTI.  ----------------------------------------- 5:42 AM on 10/15/2017 -----------------------------------------  Bladder scan shows minimal urine, so the Foley appears to be functioning normally.  Patient states he has not had to drain it several times while waiting in the emergency department.  I will discharge with an antibiotic.  The patient has no prior available urine cultures in Epic.  Return precautions given and the patient and his wife expressed understanding.     ____________________________________________   FINAL CLINICAL IMPRESSION(S) / ED DIAGNOSES  Final diagnoses:  Gross hematuria      NEW MEDICATIONS STARTED DURING THIS VISIT:  This SmartLink is deprecated. Use AVSMEDLIST instead to display the medication list for a patient.   Note:  This document was prepared using Dragon voice  recognition software and may include unintentional dictation errors.    Dionne Bucy, MD 10/15/17 [phone]

## 2017-10-15 NOTE — ED Notes (Signed)
Pt bladder scanned with a finding no greater than 64ml. MD made aware.

## 2017-10-15 NOTE — ED Notes (Signed)
Pt ambulatory outside 

## 2017-10-17 LAB — CULTURE, URINE COMPREHENSIVE

## 2017-10-20 ENCOUNTER — Ambulatory Visit: Payer: BLUE CROSS/BLUE SHIELD

## 2017-10-20 ENCOUNTER — Encounter: Payer: Self-pay | Admitting: Urology

## 2017-10-20 ENCOUNTER — Telehealth: Payer: Self-pay | Admitting: Urology

## 2017-10-20 NOTE — Telephone Encounter (Signed)
Pt walked into clinic. All questions were answered in full. Pt voiced understanding.

## 2017-10-20 NOTE — Telephone Encounter (Signed)
Pt has a leaking catheter.  He is also in a lot of pain and there's a lot of blood in bag.  Should he come in for nurse visit?  [phone]

## 2017-10-21 ENCOUNTER — Ambulatory Visit
Admission: RE | Admit: 2017-10-21 | Discharge: 2017-10-21 | Disposition: A | Discharge status: Home / Self Care | Payer: BLUE CROSS/BLUE SHIELD | Source: Ambulatory Visit | Attending: Urology | Admitting: Urology

## 2017-10-21 DIAGNOSIS — N1330 Unspecified hydronephrosis: Secondary | ICD-10-CM | POA: Insufficient documentation

## 2017-11-04 ENCOUNTER — Telehealth: Payer: Self-pay | Admitting: Family Medicine

## 2017-11-04 NOTE — Telephone Encounter (Signed)
Left message for patient to call and schedule an appointment next week for Cath removal

## 2017-11-04 NOTE — Telephone Encounter (Signed)
-----   Message from Riki AltesScott C Stoioff, MD sent at 11/03/2017  7:11 PM EST ----- Renal ultrasound showed persistent bilateral hydronephrosis.  Please schedule follow-up appointment for catheter removal and discussion of further evaluation.  It is fine to schedule next week

## 2017-11-15 ENCOUNTER — Encounter: Payer: Self-pay | Admitting: Urology

## 2017-11-15 ENCOUNTER — Ambulatory Visit (INDEPENDENT_AMBULATORY_CARE_PROVIDER_SITE_OTHER): Payer: BLUE CROSS/BLUE SHIELD | Admitting: Urology

## 2017-11-15 VITALS — BP 143/80 | HR 67 | Ht 74.0 in | Wt 198.6 lb

## 2017-11-15 DIAGNOSIS — R338 Other retention of urine: Secondary | ICD-10-CM | POA: Diagnosis not present

## 2017-11-15 DIAGNOSIS — N1330 Unspecified hydronephrosis: Secondary | ICD-10-CM

## 2017-11-15 DIAGNOSIS — N401 Enlarged prostate with lower urinary tract symptoms: Secondary | ICD-10-CM | POA: Diagnosis not present

## 2017-11-15 NOTE — H&P (View-Only) (Signed)
11/15/2017 7:04 PM   Lacy Duverney [birth year] [id number]  Referring provider: Ofilia Neas, PA-C [address] [place], [place] [address]  Chief Complaint  Patient presents with  . Benign Prostatic Hypertrophy    HPI: 62 year old male presents for follow-up of urinary retention and hydronephrosis.  I initially saw him on 10/14/2017 after a CT showed bilateral hydronephrosis and hydroureter with bladder distention.  He has a history of chronic urinary retention and had been on intermittent catheterization.  A Foley catheter was placed and a follow-up renal ultrasound while the Foley was still present showed persistent bilateral moderate-severe hydronephrosis with a decompressed bladder and bladder wall thickening.  He was seen in the ED the day after the catheter was placed for hematuria which has resolved.   PMH: Past Medical History:  Diagnosis Date  . Alcoholism (HCC) 07/22/2017  . CHF (congestive heart failure) (HCC)   . Right inguinal hernia 07/22/2017  . Thrombocytopenia (HCC) 07/23/2017  . Transaminitis 07/22/2017    Surgical History: Past Surgical History:  Procedure Laterality Date  . HERNIA REPAIR  1955    Home Medications:  Allergies as of 11/15/2017   No Known Allergies     Medication List        Accurate as of 11/15/17  7:04 PM. Always use your most recent med list.          folic acid 1 MG tablet Commonly known as:  FOLVITE Take 1 mg by mouth daily.   furosemide 40 MG tablet Commonly known as:  LASIX Take 1 tablet (40 mg total) by mouth 2 (two) times daily.   potassium chloride 8 MEQ tablet Commonly known as:  KLOR-CON Take 8 mEq by mouth 2 (two) times daily.       Allergies: No Known Allergies  Family History: Family History  Problem Relation Age of Onset  . Prostate cancer Neg Hx   . Bladder Cancer Neg Hx   . Kidney cancer Neg Hx     Social History:  reports that he quit smoking about 6 years ago. he has never used smokeless tobacco. He  reports that he drinks alcohol. He reports that he does not use drugs.  ROS: UROLOGY Frequent Urination?: No Hard to postpone urination?: No Burning/pain with urination?: No Get up at night to urinate?: No Leakage of urine?: No Urine stream starts and stops?: No Trouble starting stream?: No Do you have to strain to urinate?: No Blood in urine?: No Urinary tract infection?: No Sexually transmitted disease?: No Injury to kidneys or bladder?: No Painful intercourse?: No Weak stream?: No Erection problems?: Yes Penile pain?: No  Gastrointestinal Nausea?: No Vomiting?: No Indigestion/heartburn?: No Diarrhea?: No Constipation?: No  Constitutional Fever: No Night sweats?: No Weight loss?: No Fatigue?: No  Skin Skin rash/lesions?: No Itching?: No  Eyes Blurred vision?: Yes Double vision?: No  Ears/Nose/Throat Sore throat?: No Sinus problems?: Yes  Hematologic/Lymphatic Swollen glands?: No Easy bruising?: Yes  Cardiovascular Leg swelling?: No Chest pain?: No  Respiratory Cough?: No Shortness of breath?: No  Endocrine Excessive thirst?: No  Musculoskeletal Back pain?: Yes Joint pain?: No  Neurological Headaches?: No Dizziness?: No  Psychologic Depression?: No Anxiety?: No  Physical Exam: BP (!) 143/80 (BP Location: Right Arm, Patient Position: Sitting, Cuff Size: Large)   Pulse 67   Ht 6\' 2"  (1.88 m)   Wt 198 lb 9.6 oz (90.1 kg)   BMI 25.50 kg/m   Constitutional:  Alert and oriented, No acute distress. HEENT: [place] AT, moist  mucus membranes.  Trachea midline, no masses. Cardiovascular: No clubbing, cyanosis, or edema.  CV RRR Respiratory: Normal respiratory effort, no increased work of breathing.  Lungs clear GI: Abdomen is soft, nontender, nondistended, no abdominal masses GU: No CVA tenderness.  Foley catheter draining clear urine Skin: No rashes, bruises or suspicious lesions. Lymph: No cervical or inguinal adenopathy. Neurologic: Grossly  intact, no focal deficits, moving all 4 extremities. Psychiatric: Normal mood and affect.  Laboratory Data: Lab Results  Component Value Date   WBC 4.5 07/08/2017   HGB 16.0 07/08/2017   HCT 46.3 07/08/2017   MCV 91 07/08/2017   PLT 118 (L) 07/08/2017    Lab Results  Component Value Date   CREATININE 0.82 07/22/2017    Lab Results  Component Value Date   HGBA1C 5.1 07/22/2017    Pertinent Imaging:  Results for orders placed during the hospital encounter of 10/21/17  Ultrasound renal complete   Narrative CLINICAL DATA:  Hydronephrosis  EXAM: RENAL / URINARY TRACT ULTRASOUND COMPLETE  COMPARISON:  CT abdomen 08/24/2017  FINDINGS: Right Kidney:  Length: 13.2 cm. Echogenicity within normal limits. No mass visualized. Moderate-severe hydronephrosis.  Left Kidney:  Length: 12.3 cm. Echogenicity within normal limits. No mass visualized. Moderate-severe hydronephrosis.  Bladder:  Decompressed bladder with a Foley catheter present. Generalized bladder wall thickening.  IMPRESSION: 1. Moderate-severe bilateral hydronephrosis.   Electronically Signed   By: Elige KoHetal  Patel   On: 10/21/2017 16:25     Assessment & Plan: Ultrasound findings were discussed in detail with Mr. Ashok NorrisLoe.  Potential causes were discussed including nonobstructive hydronephrosis secondary to chronic urinary retention; vesicoureteral reflux and obstructive hydronephrosis above the bladder which is less likely since his kidney function has been normal.   I recommended scheduling cystoscopy with cystogram, bilateral retrograde pyelograms and possible bilateral ureteral stent placement.  The procedure was discussed in detail including potential risks of bleeding, infection/sepsis as well as the risks of anesthesia.  He indicated all questions were answered to his satisfaction and desires to proceed.  His Foley catheter was clamped and urine was collected for preoperative urine culture.  He declined  Foley catheter removal and starting intermittent catheterization until after his procedure.   1. Benign prostatic hyperplasia with urinary retention  - Urine Culture  2.  Bilateral hydronephrosis    Riki AltesScott C Thressa Shiffer, MD  [facility] [address], [address] [place], [place] [address] [phone]
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
## 2017-11-16 ENCOUNTER — Other Ambulatory Visit: Payer: Self-pay | Admitting: Radiology

## 2017-11-16 DIAGNOSIS — R339 Retention of urine, unspecified: Secondary | ICD-10-CM

## 2017-11-16 DIAGNOSIS — N1330 Unspecified hydronephrosis: Secondary | ICD-10-CM

## 2017-11-21 ENCOUNTER — Other Ambulatory Visit: Payer: Self-pay | Admitting: Radiology

## 2017-11-21 ENCOUNTER — Telehealth: Payer: Self-pay | Admitting: Radiology

## 2017-11-21 DIAGNOSIS — R319 Hematuria, unspecified: Secondary | ICD-10-CM

## 2017-11-21 DIAGNOSIS — N390 Urinary tract infection, site not specified: Secondary | ICD-10-CM

## 2017-11-21 MED ORDER — AMOXICILLIN 875 MG PO TABS: 875.0000 mg | ORAL_TABLET | Freq: Two times a day (BID) | ORAL | 0 refills | Status: DC

## 2017-11-21 NOTE — Progress Notes (Signed)
error 

## 2017-11-21 NOTE — Telephone Encounter (Signed)
-----   Message from Riki AltesScott C Stoioff, MD sent at 11/20/2017  9:47 AM EST ----- Preoperative urine culture was positive for enterococcus.  Please start amoxicillin 875 mg twice daily-he can start on Tuesday.

## 2017-11-21 NOTE — Telephone Encounter (Signed)
Pt notified of positive ucx & abx sent to pharmacy. Dosing instructions given. Pt voices understanding.

## 2017-11-22 ENCOUNTER — Encounter
Admission: RE | Admit: 2017-11-22 | Discharge: 2017-11-22 | Disposition: A | Discharge status: Home / Self Care | Payer: BLUE CROSS/BLUE SHIELD | Source: Ambulatory Visit | Attending: Urology | Admitting: Urology

## 2017-11-22 ENCOUNTER — Other Ambulatory Visit: Payer: Self-pay

## 2017-11-22 DIAGNOSIS — N1330 Unspecified hydronephrosis: Secondary | ICD-10-CM | POA: Diagnosis not present

## 2017-11-22 DIAGNOSIS — R339 Retention of urine, unspecified: Secondary | ICD-10-CM | POA: Diagnosis not present

## 2017-11-22 DIAGNOSIS — Z79899 Other long term (current) drug therapy: Secondary | ICD-10-CM | POA: Diagnosis not present

## 2017-11-22 DIAGNOSIS — Z87891 Personal history of nicotine dependence: Secondary | ICD-10-CM | POA: Diagnosis not present

## 2017-11-22 DIAGNOSIS — N400 Enlarged prostate without lower urinary tract symptoms: Secondary | ICD-10-CM | POA: Diagnosis not present

## 2017-11-22 DIAGNOSIS — I509 Heart failure, unspecified: Secondary | ICD-10-CM | POA: Diagnosis not present

## 2017-11-22 DIAGNOSIS — D696 Thrombocytopenia, unspecified: Secondary | ICD-10-CM | POA: Diagnosis not present

## 2017-11-22 DIAGNOSIS — F1020 Alcohol dependence, uncomplicated: Secondary | ICD-10-CM | POA: Diagnosis not present

## 2017-11-22 DIAGNOSIS — G709 Myoneural disorder, unspecified: Secondary | ICD-10-CM | POA: Diagnosis not present

## 2017-11-22 HISTORY — DX: Essential (primary) hypertension: I10

## 2017-11-22 HISTORY — DX: Myoneural disorder, unspecified: G70.9

## 2017-11-22 HISTORY — DX: Anemia, unspecified: D64.9

## 2017-11-22 HISTORY — DX: Sepsis, unspecified organism: A41.9

## 2017-11-22 HISTORY — DX: Chronic obstructive pulmonary disease, unspecified: J44.9

## 2017-11-22 HISTORY — DX: Chronic kidney disease, unspecified: N18.9

## 2017-11-22 LAB — BASIC METABOLIC PANEL
Anion gap: 10 (ref 5–15)
BUN: 22 mg/dL — ABNORMAL HIGH (ref 6–20)
CALCIUM: 9.8 mg/dL (ref 8.9–10.3)
CO2: 32 mmol/L (ref 22–32)
Chloride: 96 mmol/L — ABNORMAL LOW (ref 101–111)
Creatinine, Ser: 1.12 mg/dL (ref 0.61–1.24)
GFR calc Af Amer: >60 mL/min (ref >60)
Glucose, Bld: 85 mg/dL (ref 65–99)
POTASSIUM: 3.6 mmol/L (ref 3.5–5.1)
SODIUM: 138 mmol/L (ref 135–145)

## 2017-11-22 NOTE — Patient Instructions (Signed)
Your procedure is scheduled on: Friday 11/25/17 Report to Day Surgery. To find out your arrival time please call [phone] between 1PM - 3PM on Thurs. 11/24/16.  Remember: Instructions that are not followed completely may result in serious medical risk, up to and including death, or upon the discretion of your surgeon and anesthesiologist your surgery may need to be rescheduled.     _X__ 1. Do not eat food after midnight the night before your procedure.                 No gum chewing or hard candies. You may drink clear liquids up to 2 hours                 before you are scheduled to arrive for your surgery- DO not drink clear                 liquids within 2 hours of the start of your surgery.                 Clear Liquids include:  water, apple juice without pulp, clear carbohydrate                 drink such as Clearfast of Gartorade, Black Coffee or Tea (Do not add                 anything to coffee or tea).  __X__2.  On the morning of surgery brush your teeth with toothpaste and water, you  may rinse your mouth with mouthwash if you wish.  Do not swallow any   toothpaste of mouthwash.     _X__ 3.  No Alcohol for 24 hours before or after surgery.   ___ 4.  Do Not Smoke or use e-cigarettes For 24 Hours Prior to Your Surgery.                 Do not use any chewable tobacco products for at least 6 hours prior to                 surgery.  ____  5.  Bring all medications with you on the day of surgery if instructed.   _x___  6.  Notify your doctor if there is any change in your medical condition      (cold, fever, infections).     Do not wear jewelry, make-up, hairpins, clips or nail polish. Do not wear lotions, powders, or perfumes. You may wear deodorant. Do not shave 48 hours prior to surgery. Men may shave face and neck. Do not bring valuables to the hospital.    [facility] is not responsible for any belongings or valuables.  Contacts, dentures or  bridgework may not be worn into surgery. Leave your suitcase in the car. After surgery it may be brought to your room. For patients admitted to the hospital, discharge time is determined by your treatment team.   Patients discharged the day of surgery will not be allowed to drive home.   Please read over the following fact sheets that you were given:     __x__ Take these medicines the morning of surgery with A SIP OF WATER:    1. amoxicillin (AMOXIL) 875 MG tablet  2. atorvastatin (LIPITOR) 10 MG tablet  3.   4.  5.  6.  ____ Fleet Enema (as directed)   ____ Use CHG Soap as directed  ____ Use inhalers on the day of surgery  ____ Stop metformin  2 days prior to surgery    ____ Take 1/2 of usual insulin dose the night before surgery. No insulin the morning          of surgery.   ____ Stop Coumadin/Plavix/aspirin on   ____ Stop Anti-inflammatories on    __x__ Stop supplements until after surgery.  Ascorbic Acid (SUPER C COMPLEX PO)  ____ Bring C-Pap to the hospital.

## 2017-11-22 NOTE — Pre-Procedure Instructions (Signed)
Faxed request for H&P for procedure scheduled on 11/25/17

## 2017-11-23 LAB — URINE CULTURE

## 2017-11-24 MED ORDER — CEFAZOLIN SODIUM-DEXTROSE 2-4 GM/100ML-% IV SOLN
2.0000 g | INTRAVENOUS | Status: AC
  Administered 2017-11-25: 2 g via INTRAVENOUS

## 2017-11-24 MED ORDER — CEFAZOLIN SODIUM-DEXTROSE 2-4 GM/100ML-% IV SOLN: 2.0000 g | INTRAVENOUS | Status: DC

## 2017-11-25 ENCOUNTER — Ambulatory Visit: Payer: BLUE CROSS/BLUE SHIELD | Admitting: Anesthesiology

## 2017-11-25 ENCOUNTER — Encounter
Admission: RE | Disposition: A | Discharge status: Home / Self Care | Payer: Self-pay | Source: Ambulatory Visit | Attending: Urology

## 2017-11-25 ENCOUNTER — Encounter: Payer: Self-pay | Admitting: Anesthesiology

## 2017-11-25 ENCOUNTER — Other Ambulatory Visit: Payer: Self-pay

## 2017-11-25 ENCOUNTER — Ambulatory Visit
Admission: RE | Admit: 2017-11-25 | Discharge: 2017-11-25 | Disposition: A | Discharge status: Home / Self Care | Payer: BLUE CROSS/BLUE SHIELD | Source: Ambulatory Visit | Attending: Urology | Admitting: Urology

## 2017-11-25 DIAGNOSIS — R339 Retention of urine, unspecified: Secondary | ICD-10-CM | POA: Insufficient documentation

## 2017-11-25 DIAGNOSIS — I509 Heart failure, unspecified: Secondary | ICD-10-CM | POA: Insufficient documentation

## 2017-11-25 DIAGNOSIS — N1330 Unspecified hydronephrosis: Secondary | ICD-10-CM

## 2017-11-25 DIAGNOSIS — D696 Thrombocytopenia, unspecified: Secondary | ICD-10-CM | POA: Insufficient documentation

## 2017-11-25 DIAGNOSIS — Z79899 Other long term (current) drug therapy: Secondary | ICD-10-CM | POA: Insufficient documentation

## 2017-11-25 DIAGNOSIS — G709 Myoneural disorder, unspecified: Secondary | ICD-10-CM | POA: Insufficient documentation

## 2017-11-25 DIAGNOSIS — R338 Other retention of urine: Secondary | ICD-10-CM | POA: Diagnosis not present

## 2017-11-25 DIAGNOSIS — N400 Enlarged prostate without lower urinary tract symptoms: Secondary | ICD-10-CM | POA: Insufficient documentation

## 2017-11-25 DIAGNOSIS — F1020 Alcohol dependence, uncomplicated: Secondary | ICD-10-CM | POA: Insufficient documentation

## 2017-11-25 DIAGNOSIS — Z87891 Personal history of nicotine dependence: Secondary | ICD-10-CM | POA: Insufficient documentation

## 2017-11-25 HISTORY — PX: CYSTOGRAM: SHX6285

## 2017-11-25 HISTORY — PX: CYSTOSCOPY W/ RETROGRADES: SHX1426

## 2017-11-25 SURGERY — CYSTOSCOPY, WITH RETROGRADE PYELOGRAM
Anesthesia: General | Site: Ureter | Wound class: Clean Contaminated

## 2017-11-25 MED ORDER — LIDOCAINE HCL (PF) 2 % IJ SOLN
INTRAMUSCULAR | Status: DC | PRN
  Administered 2017-11-25: 50 mg

## 2017-11-25 MED ORDER — LIDOCAINE HCL (PF) 2 % IJ SOLN
INTRAMUSCULAR | Status: AC
  Filled 2017-11-25: qty 10

## 2017-11-25 MED ORDER — OXYCODONE HCL 5 MG PO TABS: 5.0000 mg | ORAL_TABLET | Freq: Once | ORAL | Status: DC | PRN

## 2017-11-25 MED ORDER — FENTANYL CITRATE (PF) 100 MCG/2ML IJ SOLN
INTRAMUSCULAR | Status: AC
  Filled 2017-11-25: qty 2

## 2017-11-25 MED ORDER — GLYCOPYRROLATE 0.2 MG/ML IJ SOLN
INTRAMUSCULAR | Status: DC | PRN
  Administered 2017-11-25: 0.2 mg via INTRAVENOUS

## 2017-11-25 MED ORDER — PROPOFOL 10 MG/ML IV BOLUS
INTRAVENOUS | Status: DC | PRN
  Administered 2017-11-25: 150 mg via INTRAVENOUS

## 2017-11-25 MED ORDER — FAMOTIDINE 20 MG PO TABS
20.0000 mg | ORAL_TABLET | Freq: Once | ORAL | Status: AC
  Administered 2017-11-25: 20 mg via ORAL

## 2017-11-25 MED ORDER — FENTANYL CITRATE (PF) 100 MCG/2ML IJ SOLN
INTRAMUSCULAR | Status: DC | PRN
  Administered 2017-11-25 (×4): 50 ug via INTRAVENOUS

## 2017-11-25 MED ORDER — GLYCOPYRROLATE 0.2 MG/ML IJ SOLN
INTRAMUSCULAR | Status: AC
  Filled 2017-11-25: qty 1

## 2017-11-25 MED ORDER — DEXAMETHASONE SODIUM PHOSPHATE 10 MG/ML IJ SOLN
INTRAMUSCULAR | Status: AC
  Filled 2017-11-25: qty 1

## 2017-11-25 MED ORDER — ONDANSETRON HCL 4 MG/2ML IJ SOLN
INTRAMUSCULAR | Status: DC | PRN
  Administered 2017-11-25: 4 mg via INTRAVENOUS

## 2017-11-25 MED ORDER — PHENYLEPHRINE HCL 10 MG/ML IJ SOLN
INTRAMUSCULAR | Status: AC
  Filled 2017-11-25: qty 1

## 2017-11-25 MED ORDER — IOTHALAMATE MEGLUMINE 43 % IV SOLN
INTRAVENOUS | Status: DC | PRN
  Administered 2017-11-25: 2520 mL via URETHRAL

## 2017-11-25 MED ORDER — PROPOFOL 10 MG/ML IV BOLUS
INTRAVENOUS | Status: AC
  Filled 2017-11-25: qty 20

## 2017-11-25 MED ORDER — LACTATED RINGERS IV SOLN
INTRAVENOUS | Status: DC
  Administered 2017-11-25: 07:00:00 via INTRAVENOUS

## 2017-11-25 MED ORDER — CEFAZOLIN SODIUM-DEXTROSE 2-4 GM/100ML-% IV SOLN
INTRAVENOUS | Status: AC
  Filled 2017-11-25: qty 100

## 2017-11-25 MED ORDER — OXYCODONE HCL 5 MG/5ML PO SOLN: 5.0000 mg | Freq: Once | ORAL | Status: DC | PRN

## 2017-11-25 MED ORDER — ONDANSETRON HCL 4 MG/2ML IJ SOLN
INTRAMUSCULAR | Status: AC
  Filled 2017-11-25: qty 2

## 2017-11-25 MED ORDER — FENTANYL CITRATE (PF) 100 MCG/2ML IJ SOLN: 25.0000 ug | INTRAMUSCULAR | Status: DC | PRN

## 2017-11-25 MED ORDER — MIDAZOLAM HCL 2 MG/2ML IJ SOLN
INTRAMUSCULAR | Status: AC
  Filled 2017-11-25: qty 2

## 2017-11-25 MED ORDER — MIDAZOLAM HCL 5 MG/5ML IJ SOLN
INTRAMUSCULAR | Status: DC | PRN
  Administered 2017-11-25: 2 mg via INTRAVENOUS

## 2017-11-25 MED ORDER — FAMOTIDINE 20 MG PO TABS
ORAL_TABLET | ORAL | Status: AC
  Filled 2017-11-25: qty 1

## 2017-11-25 MED ORDER — DEXAMETHASONE SODIUM PHOSPHATE 10 MG/ML IJ SOLN
INTRAMUSCULAR | Status: DC | PRN
  Administered 2017-11-25: 5 mg via INTRAVENOUS

## 2017-11-25 SURGICAL SUPPLY — 34 items
BAG DRAIN CYSTO-URO LG1000N (MISCELLANEOUS) ×5 IMPLANT
BRUSH SCRUB EZ  4% CHG (MISCELLANEOUS) ×2
BRUSH SCRUB EZ 4% CHG (MISCELLANEOUS) ×3 IMPLANT
CATH FOL 2WAY LX 16X30 (CATHETERS) ×5 IMPLANT
CATH URETL 5X70 OPEN END (CATHETERS) ×5 IMPLANT
CONRAY 43 FOR UROLOGY 50M (MISCELLANEOUS) ×5 IMPLANT
DRAPE UTILITY 15X26 TOWEL STRL (DRAPES) ×5 IMPLANT
ELECT REM PT RETURN 9FT ADLT (ELECTROSURGICAL) ×5
ELECTRODE REM PT RTRN 9FT ADLT (ELECTROSURGICAL) ×3 IMPLANT
GLIDEWIRE STR 0.035 150CM 3CM (WIRE) ×5 IMPLANT
GLOVE BIO SURGEON STRL SZ8 (GLOVE) ×5 IMPLANT
GLOVE BIOGEL PI IND STRL 8 (GLOVE) ×3 IMPLANT
GLOVE BIOGEL PI INDICATOR 8 (GLOVE) ×2
GOWN STANDARD XL  REUSABL (MISCELLANEOUS) ×5 IMPLANT
GOWN STRL REUS W/ TWL LRG LVL3 (GOWN DISPOSABLE) ×6 IMPLANT
GOWN STRL REUS W/ TWL LRG LVL4 (GOWN DISPOSABLE) ×6 IMPLANT
GOWN STRL REUS W/ TWL XL LVL3 (GOWN DISPOSABLE) ×3 IMPLANT
GOWN STRL REUS W/TWL LRG LVL3 (GOWN DISPOSABLE) ×4
GOWN STRL REUS W/TWL LRG LVL4 (GOWN DISPOSABLE) ×4
GOWN STRL REUS W/TWL XL LVL3 (GOWN DISPOSABLE) ×2
KIT TURNOVER CYSTO (KITS) ×5 IMPLANT
PACK CYSTO AR (MISCELLANEOUS) ×5 IMPLANT
SENSORWIRE 0.038 NOT ANGLED (WIRE) ×5
SET CYSTO W/LG BORE CLAMP LF (SET/KITS/TRAYS/PACK) ×5 IMPLANT
SOL .9 NS 3000ML IRR  AL (IV SOLUTION) ×2
SOL .9 NS 3000ML IRR UROMATIC (IV SOLUTION) ×3 IMPLANT
STENT URET 6FRX24 CONTOUR (STENTS) IMPLANT
STENT URET 6FRX26 CONTOUR (STENTS) IMPLANT
SURGILUBE 2OZ TUBE FLIPTOP (MISCELLANEOUS) ×5 IMPLANT
SYR 50ML LL SCALE MARK (SYRINGE) ×5 IMPLANT
SYRINGE IRR TOOMEY STRL 70CC (SYRINGE) ×5 IMPLANT
WATER STERILE IRR 1000ML POUR (IV SOLUTION) ×5 IMPLANT
WATER STERILE IRR 3000ML UROMA (IV SOLUTION) ×5 IMPLANT
WIRE SENSOR 0.038 NOT ANGLED (WIRE) ×3 IMPLANT

## 2017-11-25 NOTE — OR Nursing (Signed)
Leg bag given for home use, prefers to go home today with bedside bag.  Advises he has had foley before and feels comfortable with changing bag when he wants to use it.

## 2017-11-25 NOTE — Anesthesia Post-op Follow-up Note (Signed)
Anesthesia QCDR form completed.        

## 2017-11-25 NOTE — Anesthesia Procedure Notes (Signed)
Procedure Name: LMA Insertion Performed by: Manila Rommel, CRNA Pre-anesthesia Checklist: Patient identified, Patient being monitored, Timeout performed, Emergency Drugs available and Suction available Patient Re-evaluated:Patient Re-evaluated prior to induction Oxygen Delivery Method: Circle system utilized Preoxygenation: Pre-oxygenation with 100% oxygen Induction Type: IV induction Ventilation: Mask ventilation without difficulty LMA: LMA inserted LMA Size: 4.5 Tube type: Oral Number of attempts: 1 Placement Confirmation: positive ETCO2 and breath sounds checked- equal and bilateral Tube secured with: Tape Dental Injury: Teeth and Oropharynx as per pre-operative assessment        

## 2017-11-25 NOTE — Discharge Instructions (Signed)

## 2017-11-25 NOTE — Anesthesia Postprocedure Evaluation (Signed)
Anesthesia Post Note  Patient: Nicholas Cunningham  Procedure(s) Performed: CYSTOSCOPY WITH RETROGRADE PYELOGRAM (Bilateral Ureter) CYSTOGRAM (N/A Bladder)  Patient location during evaluation: PACU Anesthesia Type: General Level of consciousness: awake and alert Pain management: pain level controlled Vital Signs Assessment: post-procedure vital signs reviewed and stable Respiratory status: spontaneous breathing, nonlabored ventilation, respiratory function stable and patient connected to nasal cannula oxygen Cardiovascular status: blood pressure returned to baseline and stable Postop Assessment: no apparent nausea or vomiting Anesthetic complications: no     Last Vitals:  Vitals:   11/25/17 0916 11/25/17 0942  BP: 125/68 125/65  Pulse: 66 63  Resp: 14 14  Temp: 36.4 C (!) 36.1 C  SpO2: 97% 100%    Last Pain:  Vitals:   11/25/17 0942  TempSrc: Temporal  PainSc:                  Cleda MccreedyJoseph K Kadeem Hyle

## 2017-11-25 NOTE — Transfer of Care (Signed)
Immediate Anesthesia Transfer of Care Note  Patient: Nicholas Cunningham  Procedure(s) Performed: CYSTOSCOPY WITH RETROGRADE PYELOGRAM (Bilateral Ureter) CYSTOGRAM (N/A Bladder)  Patient Location: PACU  Anesthesia Type:General  Level of Consciousness: sedated  Airway & Oxygen Therapy: Patient Spontanous Breathing and Patient connected to face mask oxygen  Post-op Assessment: Report given to RN and Post -op Vital signs reviewed and stable  Post vital signs: Reviewed  Last Vitals:  Vitals:   11/25/17 0657 11/25/17 0831  BP: 133/76 112/68  Pulse: 79 65  Resp: 16 10  Temp: 36.9 C   SpO2: 99% 99%    Last Pain:  Vitals:   11/25/17 0657  TempSrc: Oral         Complications: No apparent anesthesia complications

## 2017-11-25 NOTE — Interval H&P Note (Signed)
History and Physical Interval Note:  11/25/2017 7:25 AM  Nicholas PatchPaul Byron Faraci  has presented today for surgery, with the diagnosis of Bilateral hydronephrosis, urinary retention  The various methods of treatment have been discussed with the patient and family. After consideration of risks, benefits and other options for treatment, the patient has consented to  Procedure(s): CYSTOSCOPY WITH RETROGRADE PYELOGRAM (Bilateral) CYSTOGRAM (N/A) CYSTOSCOPY WITH STENT PLACEMENT (Bilateral) as a surgical intervention .  The patient's history has been reviewed, patient examined, no change in status, stable for surgery.  I have reviewed the patient's chart and labs.  Questions were answered to the patient's satisfaction.    CV RRR; Lungs clear   Clever Geraldo C Mylasia Vorhees

## 2017-11-25 NOTE — Op Note (Signed)
Preoperative diagnosis:  1. Urinary retention 2. Bilateral hydronephrosis  Postoperative diagnosis:  1. Urinary retention 2. Left hydronephrosis-nonobstructive  Procedure: 1. Cystoscopy with bilateral retrograde pyelograms 2. Cystogram  Surgeon: Riki AltesScott C Raeonna Milo, MD  Anesthesia: General  Complications: None  Intraoperative findings: 1.  Right retrograde pyelogram-normal caliber ureter without dilation or filling defect.  Mild fullness of the right collecting system however good emptying of the system noted on real-time fluoroscopy  2.  Left retrograde pyelogram-moderate right hydronephrosis with hydroureter to the right UVJ.  Brisk efflux of contrast noted under direct vision with emptying noted on real-time fluoroscopy  3.  Cystogram-no evidence of vesicoureteral reflux  EBL: Minimal  Specimens: None  Indication: Nicholas Cunningham is a 62 y.o. patient with a history of chronic urinary retention.  A CT performed in late December 2018 showed bilateral hydronephrosis and hydroureter with a distended bladder.  He has a prior history of urinary retention and had been on intermittent catheterization however it had been several months since he was catheterizing.  A Foley catheter was placed and has been indwelling.  A follow-up renal ultrasound showed persistent bilateral hydronephrosis.  After reviewing the management options for treatment, he elected to proceed with the above surgical procedure(s). We have discussed the potential benefits and risks of the procedure, side effects of the proposed treatment, the likelihood of the patient achieving the goals of the procedure, and any potential problems that might occur during the procedure or recuperation. Informed consent has been obtained.  Description of procedure:  The patient was taken to the operating room and general anesthesia was induced.  The patient was placed in the dorsal lithotomy position, prepped and draped in the usual sterile  fashion, and preoperative antibiotics were administered. A preoperative time-out was performed.   A 22 French cystoscope was lubricated and passed under direct vision.  The urethra was normal in caliber without stricture.  The prostatic urethra demonstrated mild lateral lobe enlargement with mild bladder neck elevation.  The prostatic urethral length was approximately 2 cm.  The bladder mucosa was closely inspected.  There was mild erythema present secondary to his indwelling Foley catheter.  No solid or papillary lesions were noted.  There was no evidence of bladder involvement in his large right inguinal hernia.  There was moderate bladder trabeculation present.  The ureteral orifices were normal appearing bilaterally with clear efflux noted.  Cystogram was performed with approximately 500 mL of Conray with findings as described above.  Attention was directed to the right ureteral orifice.  A 0.038 guidewire was inserted the right ureteral orifice and passed proximally.  A 6 French open-ended ureteral catheter was placed over the wire to the proximal ureter.  The guidewire was removed and pull out retrograde pyelogram was performed with findings as described above.  Brisk efflux of contrast was noted from the right ureter with good emptying noted on fluoroscopy.  Attention was directed to the contralateral left ureteral orifice and an identical procedure was performed with findings as described above.  There was brisk efflux of contrast noted on direct vision from a normal-appearing left ureteral orifice.  The bladder was emptied and the cystoscope was removed.  After anesthetic reversal he was taken to the PACU in stable condition.   Riki AltesScott C Jennise Both, M.D.
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
## 2017-12-15 ENCOUNTER — Other Ambulatory Visit: Payer: Self-pay

## 2017-12-15 ENCOUNTER — Encounter (HOSPITAL_COMMUNITY): Payer: Self-pay | Admitting: *Deleted

## 2017-12-15 NOTE — Progress Notes (Signed)
Anesthesia Chart Review:  Pt is a 62 year old male scheduled for open R inguinal hernia repair, insertion of mesh on 12/19/2017 with Phylliss Blakeshelsea Connor, MD  - PCP is Deliah BostonMichael Clark, PA - Urologist is Irineo AxonScott Stoioff, MD  PMH includes:  CHF, HTN,OSA, COPD, anemia, alcohol abuse, enlarged liver, elevated LFTs, thrombocytopenia, hydronephrosis. Former smoker. BMI 25.5. S/p cystoscopy 11/25/17  - Pt reports he is not currently using alcohol   Medications include: lipitor, iron, lasix, potassium  Labs will be obtained day of surgery. - Prior AST 167, ALT 114 on 07/08/17 - Prior platelets 118 on 07/08/17  US renal 10/21/17:  1. Moderate-severe bilateral hydronephrosis.  CT abdomen/pelvis 08/24/17:  1. A large right inguinal hernia extending into the scrotum containing distal small bowel and proximal colon. No evidence of bowel obstruction. 2. Moderate to severe bilateral hydroureteronephrosis to the bladder without obstructing cause identified. Bladder distention and mild bladder wall thickening suggests chronic obstruction.  3. Probable mild hepatic steatosis 4.  Aortic Atherosclerosis  US abdomen 07/26/17:  1. Increased hepatic echogenicity consistent with fatty infiltration and/or hepatocellular disease. No focal hepatic abnormality. Portal vein is patent with normal direction of flow. No gallstones or biliary distention. 2. Incidental note is made of hydronephrosis on the right. CT data pelvis can be obtained to further evaluate.  If labs acceptable day of surgery, I anticipate pt can proceed with surgery as scheduled.   Rica Mastngela Xayla Puzio, FNP-BC [facility]Anesthesiology Phone: [phone] 12/15/2017 12:47 PM

## 2017-12-15 NOTE — Progress Notes (Signed)
   12/15/17 1226  OBSTRUCTIVE SLEEP APNEA  Have you ever been diagnosed with sleep apnea through a sleep study? No  Do you snore loudly (loud enough to be heard through closed doors)?  1  Do you often feel tired, fatigued, or sleepy during the daytime (such as falling asleep during driving or talking to someone)? 0  Has anyone observed you stop breathing during your sleep? 1  Do you have, or are you being treated for high blood pressure? 1  BMI more than 35 kg/m2? 0  Age > 50 (1-yes) 1  Male Gender (Yes=1) 1  Obstructive Sleep Apnea Score 5

## 2017-12-15 NOTE — Progress Notes (Signed)
Spoke with pt for pre-op call. Pt states he had CHF one time. Recent Echo showed no heart failure. Pt states he has been told he has an enlarged liver. Pt has hx of ETOH abuse, but denies ETOH use now. Pt denies any other cardiac history, chest pain or sob.  Will have Anesthesia PA or NP review chart.

## 2017-12-19 ENCOUNTER — Encounter (HOSPITAL_COMMUNITY): Payer: Self-pay

## 2017-12-19 ENCOUNTER — Encounter (HOSPITAL_COMMUNITY)
Admission: RE | Disposition: A | Discharge status: Home / Self Care | Payer: Self-pay | Source: Ambulatory Visit | Attending: Surgery

## 2017-12-19 ENCOUNTER — Ambulatory Visit (HOSPITAL_COMMUNITY): Payer: BLUE CROSS/BLUE SHIELD | Admitting: Emergency Medicine

## 2017-12-19 ENCOUNTER — Ambulatory Visit (HOSPITAL_COMMUNITY)
Admission: RE | Admit: 2017-12-19 | Discharge: 2017-12-19 | Disposition: A | Discharge status: Home / Self Care | Payer: BLUE CROSS/BLUE SHIELD | Source: Ambulatory Visit | Attending: Surgery | Admitting: Surgery

## 2017-12-19 ENCOUNTER — Other Ambulatory Visit: Payer: Self-pay

## 2017-12-19 DIAGNOSIS — G4733 Obstructive sleep apnea (adult) (pediatric): Secondary | ICD-10-CM | POA: Insufficient documentation

## 2017-12-19 DIAGNOSIS — I509 Heart failure, unspecified: Secondary | ICD-10-CM | POA: Insufficient documentation

## 2017-12-19 DIAGNOSIS — N320 Bladder-neck obstruction: Secondary | ICD-10-CM | POA: Diagnosis not present

## 2017-12-19 DIAGNOSIS — Z87891 Personal history of nicotine dependence: Secondary | ICD-10-CM | POA: Insufficient documentation

## 2017-12-19 DIAGNOSIS — K709 Alcoholic liver disease, unspecified: Secondary | ICD-10-CM | POA: Diagnosis not present

## 2017-12-19 DIAGNOSIS — F419 Anxiety disorder, unspecified: Secondary | ICD-10-CM | POA: Insufficient documentation

## 2017-12-19 DIAGNOSIS — Z79899 Other long term (current) drug therapy: Secondary | ICD-10-CM | POA: Diagnosis not present

## 2017-12-19 DIAGNOSIS — K4030 Unilateral inguinal hernia, with obstruction, without gangrene, not specified as recurrent: Secondary | ICD-10-CM | POA: Diagnosis not present

## 2017-12-19 DIAGNOSIS — K381 Appendicular concretions: Secondary | ICD-10-CM | POA: Insufficient documentation

## 2017-12-19 DIAGNOSIS — J449 Chronic obstructive pulmonary disease, unspecified: Secondary | ICD-10-CM | POA: Insufficient documentation

## 2017-12-19 DIAGNOSIS — N1330 Unspecified hydronephrosis: Secondary | ICD-10-CM | POA: Insufficient documentation

## 2017-12-19 DIAGNOSIS — I110 Hypertensive heart disease with heart failure: Secondary | ICD-10-CM | POA: Diagnosis not present

## 2017-12-19 HISTORY — PX: INSERTION OF MESH: SHX5868

## 2017-12-19 HISTORY — DX: Personal history of other specified conditions: Z87.898

## 2017-12-19 HISTORY — PX: APPENDECTOMY: SHX54

## 2017-12-19 HISTORY — DX: Personal history of other diseases of the digestive system: Z87.19

## 2017-12-19 HISTORY — DX: Anxiety disorder, unspecified: F41.9

## 2017-12-19 HISTORY — DX: Hepatomegaly, not elsewhere classified: R16.0

## 2017-12-19 HISTORY — PX: INGUINAL HERNIA REPAIR: SHX194

## 2017-12-19 LAB — CBC WITH DIFFERENTIAL/PLATELET
BASOS ABS: 0 10*3/uL (ref 0.0–0.1)
BASOS PCT: 0 %
EOS ABS: 0.1 10*3/uL (ref 0.0–0.7)
Eosinophils Relative: 2 %
HEMATOCRIT: 45.4 % (ref 39.0–52.0)
HEMOGLOBIN: 15.9 g/dL (ref 13.0–17.0)
Lymphocytes Relative: 32 %
Lymphs Abs: 1.9 10*3/uL (ref 0.7–4.0)
MCH: 31.7 pg (ref 26.0–34.0)
MCHC: 35 g/dL (ref 30.0–36.0)
MCV: 90.4 fL (ref 78.0–100.0)
MONOS PCT: 7 %
Monocytes Absolute: 0.4 10*3/uL (ref 0.1–1.0)
NEUTROS ABS: 3.6 10*3/uL (ref 1.7–7.7)
NEUTROS PCT: 59 %
Platelets: UNDETERMINED 10*3/uL (ref 150–400)
RBC: 5.02 MIL/uL (ref 4.22–5.81)
RDW: 12 % (ref 11.5–15.5)
WBC: 6.1 10*3/uL (ref 4.0–10.5)

## 2017-12-19 LAB — COMPREHENSIVE METABOLIC PANEL
ALBUMIN: 4.1 g/dL (ref 3.5–5.0)
ALK PHOS: 51 U/L (ref 38–126)
ALT: 21 U/L (ref 17–63)
ANION GAP: 11 (ref 5–15)
AST: 26 U/L (ref 15–41)
BUN: 23 mg/dL — ABNORMAL HIGH (ref 6–20)
CALCIUM: 9.7 mg/dL (ref 8.9–10.3)
CO2: 27 mmol/L (ref 22–32)
Chloride: 101 mmol/L (ref 101–111)
Creatinine, Ser: 1.12 mg/dL (ref 0.61–1.24)
GFR calc Af Amer: >60 mL/min (ref >60)
GFR calc non Af Amer: >60 mL/min (ref >60)
GLUCOSE: 105 mg/dL — AB (ref 65–99)
Potassium: 3.7 mmol/L (ref 3.5–5.1)
Sodium: 139 mmol/L (ref 135–145)
Total Bilirubin: 0.9 mg/dL (ref 0.3–1.2)
Total Protein: 7.7 g/dL (ref 6.5–8.1)

## 2017-12-19 LAB — PROTIME-INR
INR: 1.02
PROTHROMBIN TIME: 13.4 s (ref 11.4–15.2)

## 2017-12-19 LAB — APTT: APTT: 29 s (ref 24–36)

## 2017-12-19 SURGERY — REPAIR, HERNIA, INGUINAL, ADULT
Anesthesia: General | Site: Groin | Laterality: Right

## 2017-12-19 MED ORDER — FENTANYL CITRATE (PF) 100 MCG/2ML IJ SOLN: 25.0000 ug | INTRAMUSCULAR | Status: DC | PRN

## 2017-12-19 MED ORDER — SODIUM CHLORIDE 0.9 % IV SOLN: 250.0000 mL | INTRAVENOUS | Status: DC | PRN

## 2017-12-19 MED ORDER — ROCURONIUM BROMIDE 50 MG/5ML IV SOLN
INTRAVENOUS | Status: AC
  Filled 2017-12-19: qty 1

## 2017-12-19 MED ORDER — DEXAMETHASONE SODIUM PHOSPHATE 10 MG/ML IJ SOLN
INTRAMUSCULAR | Status: AC
  Filled 2017-12-19: qty 1

## 2017-12-19 MED ORDER — ACETAMINOPHEN 325 MG PO TABS: 650.0000 mg | ORAL_TABLET | ORAL | Status: DC | PRN

## 2017-12-19 MED ORDER — HYDROMORPHONE HCL 1 MG/ML IJ SOLN
INTRAMUSCULAR | Status: AC
  Filled 2017-12-19: qty 1

## 2017-12-19 MED ORDER — FENTANYL CITRATE (PF) 250 MCG/5ML IJ SOLN
INTRAMUSCULAR | Status: AC
  Filled 2017-12-19: qty 5

## 2017-12-19 MED ORDER — 0.9 % SODIUM CHLORIDE (POUR BTL) OPTIME
TOPICAL | Status: DC | PRN
  Administered 2017-12-19: 1000 mL

## 2017-12-19 MED ORDER — DEXAMETHASONE SODIUM PHOSPHATE 10 MG/ML IJ SOLN
INTRAMUSCULAR | Status: DC | PRN
  Administered 2017-12-19: 5 mg via INTRAVENOUS

## 2017-12-19 MED ORDER — SODIUM CHLORIDE 0.9% FLUSH: 3.0000 mL | INTRAVENOUS | Status: DC | PRN

## 2017-12-19 MED ORDER — HYDROMORPHONE HCL 1 MG/ML IJ SOLN
INTRAMUSCULAR | Status: AC
  Filled 2017-12-19: qty 0.5

## 2017-12-19 MED ORDER — ONDANSETRON HCL 4 MG/2ML IJ SOLN
INTRAMUSCULAR | Status: AC
  Filled 2017-12-19: qty 2

## 2017-12-19 MED ORDER — OXYCODONE-ACETAMINOPHEN 5-325 MG PO TABS: 1.0000 | ORAL_TABLET | Freq: Four times a day (QID) | ORAL | 0 refills | Status: AC | PRN

## 2017-12-19 MED ORDER — FENTANYL CITRATE (PF) 250 MCG/5ML IJ SOLN
INTRAMUSCULAR | Status: DC | PRN
  Administered 2017-12-19: 100 ug via INTRAVENOUS
  Administered 2017-12-19: 50 ug via INTRAVENOUS
  Administered 2017-12-19: 100 ug via INTRAVENOUS

## 2017-12-19 MED ORDER — CHLORHEXIDINE GLUCONATE 4 % EX LIQD: 60.0000 mL | Freq: Once | CUTANEOUS | Status: DC

## 2017-12-19 MED ORDER — OXYCODONE HCL 5 MG PO TABS: 5.0000 mg | ORAL_TABLET | ORAL | Status: DC | PRN

## 2017-12-19 MED ORDER — SUGAMMADEX SODIUM 200 MG/2ML IV SOLN
INTRAVENOUS | Status: DC | PRN
  Administered 2017-12-19: 200 mg via INTRAVENOUS

## 2017-12-19 MED ORDER — BUPIVACAINE-EPINEPHRINE (PF) 0.5% -1:200000 IJ SOLN
INTRAMUSCULAR | Status: AC
  Filled 2017-12-19: qty 30

## 2017-12-19 MED ORDER — HYDROMORPHONE HCL 1 MG/ML IJ SOLN
0.2500 mg | INTRAMUSCULAR | Status: DC | PRN
  Administered 2017-12-19: 0.5 mg via INTRAVENOUS

## 2017-12-19 MED ORDER — SODIUM CHLORIDE 0.9% FLUSH: 3.0000 mL | Freq: Two times a day (BID) | INTRAVENOUS | Status: DC

## 2017-12-19 MED ORDER — ACETAMINOPHEN 500 MG PO TABS
1000.0000 mg | ORAL_TABLET | ORAL | Status: AC
Start: 2017-12-19 — End: 2017-12-19
  Administered 2017-12-19: 1000 mg via ORAL
  Filled 2017-12-19: qty 2

## 2017-12-19 MED ORDER — CELECOXIB 200 MG PO CAPS
200.0000 mg | ORAL_CAPSULE | ORAL | Status: AC
  Administered 2017-12-19: 200 mg via ORAL
  Filled 2017-12-19: qty 1

## 2017-12-19 MED ORDER — LACTATED RINGERS IV SOLN
INTRAVENOUS | Status: DC | PRN
  Administered 2017-12-19: 07:00:00 via INTRAVENOUS

## 2017-12-19 MED ORDER — MIDAZOLAM HCL 2 MG/2ML IJ SOLN
INTRAMUSCULAR | Status: AC
  Filled 2017-12-19: qty 2

## 2017-12-19 MED ORDER — ACETAMINOPHEN 650 MG RE SUPP: 650.0000 mg | RECTAL | Status: DC | PRN

## 2017-12-19 MED ORDER — MIDAZOLAM HCL 2 MG/2ML IJ SOLN
INTRAMUSCULAR | Status: DC | PRN
  Administered 2017-12-19: 2 mg via INTRAVENOUS

## 2017-12-19 MED ORDER — FENTANYL CITRATE (PF) 100 MCG/2ML IJ SOLN
INTRAMUSCULAR | Status: AC
  Filled 2017-12-19: qty 2

## 2017-12-19 MED ORDER — BUPIVACAINE-EPINEPHRINE 0.5% -1:200000 IJ SOLN
INTRAMUSCULAR | Status: DC | PRN
  Administered 2017-12-19: 24 mL

## 2017-12-19 MED ORDER — LIDOCAINE 2% (20 MG/ML) 5 ML SYRINGE
INTRAMUSCULAR | Status: DC | PRN
  Administered 2017-12-19: 100 mg via INTRAVENOUS

## 2017-12-19 MED ORDER — LIDOCAINE 2% (20 MG/ML) 5 ML SYRINGE
INTRAMUSCULAR | Status: AC
  Filled 2017-12-19: qty 5

## 2017-12-19 MED ORDER — PROPOFOL 10 MG/ML IV BOLUS
INTRAVENOUS | Status: DC | PRN
  Administered 2017-12-19: 150 mg via INTRAVENOUS
  Administered 2017-12-19: 50 mg via INTRAVENOUS

## 2017-12-19 MED ORDER — ONDANSETRON HCL 4 MG/2ML IJ SOLN
INTRAMUSCULAR | Status: DC | PRN
  Administered 2017-12-19: 4 mg via INTRAVENOUS

## 2017-12-19 MED ORDER — DOCUSATE SODIUM 100 MG PO CAPS: 100.0000 mg | ORAL_CAPSULE | Freq: Two times a day (BID) | ORAL | 0 refills | Status: AC

## 2017-12-19 MED ORDER — CEFAZOLIN SODIUM-DEXTROSE 2-4 GM/100ML-% IV SOLN
INTRAVENOUS | Status: AC
  Filled 2017-12-19: qty 100

## 2017-12-19 MED ORDER — MEPERIDINE HCL 50 MG/ML IJ SOLN: 6.2500 mg | INTRAMUSCULAR | Status: DC | PRN

## 2017-12-19 MED ORDER — ROCURONIUM BROMIDE 50 MG/5ML IV SOLN
INTRAVENOUS | Status: AC
  Filled 2017-12-19: qty 2

## 2017-12-19 MED ORDER — ROCURONIUM BROMIDE 10 MG/ML (PF) SYRINGE
PREFILLED_SYRINGE | INTRAVENOUS | Status: DC | PRN
  Administered 2017-12-19 (×2): 10 mg via INTRAVENOUS
  Administered 2017-12-19: 50 mg via INTRAVENOUS
  Administered 2017-12-19: 10 mg via INTRAVENOUS
  Administered 2017-12-19: 20 mg via INTRAVENOUS

## 2017-12-19 MED ORDER — GABAPENTIN 300 MG PO CAPS
300.0000 mg | ORAL_CAPSULE | ORAL | Status: AC
  Administered 2017-12-19: 300 mg via ORAL
  Filled 2017-12-19: qty 1

## 2017-12-19 MED ORDER — CEFAZOLIN SODIUM-DEXTROSE 2-3 GM-%(50ML) IV SOLR
INTRAVENOUS | Status: DC | PRN
  Administered 2017-12-19: 2 g via INTRAVENOUS

## 2017-12-19 MED ORDER — ONDANSETRON HCL 4 MG/2ML IJ SOLN: 4.0000 mg | Freq: Once | INTRAMUSCULAR | Status: DC | PRN

## 2017-12-19 SURGICAL SUPPLY — 48 items
BENZOIN TINCTURE PRP APPL 2/3 (GAUZE/BANDAGES/DRESSINGS) ×4 IMPLANT
BLADE CLIPPER SURG (BLADE) IMPLANT
BLADE SURG 15 STRL LF DISP TIS (BLADE) ×2 IMPLANT
BLADE SURG 15 STRL SS (BLADE) ×2
CHLORAPREP W/TINT 26ML (MISCELLANEOUS) ×4 IMPLANT
CLOSURE WOUND 1/2 X4 (GAUZE/BANDAGES/DRESSINGS) ×1
COVER SURGICAL LIGHT HANDLE (MISCELLANEOUS) ×4 IMPLANT
DRAIN PENROSE 1/2X12 LTX STRL (WOUND CARE) ×4 IMPLANT
DRAPE LAPAROTOMY TRNSV 102X78 (DRAPE) IMPLANT
DRAPE UTILITY XL STRL (DRAPES) ×4 IMPLANT
DRSG TEGADERM 4X4.75 (GAUZE/BANDAGES/DRESSINGS) ×4 IMPLANT
ELECT CAUTERY BLADE 6.4 (BLADE) ×4 IMPLANT
ELECT REM PT RETURN 9FT ADLT (ELECTROSURGICAL) ×4
ELECTRODE REM PT RTRN 9FT ADLT (ELECTROSURGICAL) ×2 IMPLANT
GAUZE SPONGE 4X4 12PLY STRL (GAUZE/BANDAGES/DRESSINGS) ×4 IMPLANT
GAUZE SPONGE 4X4 16PLY XRAY LF (GAUZE/BANDAGES/DRESSINGS) ×4 IMPLANT
GLOVE BIO SURGEON STRL SZ 6 (GLOVE) ×4 IMPLANT
GLOVE BIOGEL PI IND STRL 6.5 (GLOVE) ×2 IMPLANT
GLOVE BIOGEL PI INDICATOR 6.5 (GLOVE) ×2
GOWN STRL REUS W/ TWL LRG LVL3 (GOWN DISPOSABLE) ×4 IMPLANT
GOWN STRL REUS W/TWL LRG LVL3 (GOWN DISPOSABLE) ×4
KIT BASIN OR (CUSTOM PROCEDURE TRAY) ×4 IMPLANT
KIT ROOM TURNOVER OR (KITS) ×4 IMPLANT
LIGASURE IMPACT 36 18CM CVD LR (INSTRUMENTS) ×4 IMPLANT
MESH PHASIX RESORB RECT 10X15 (Mesh General) ×4 IMPLANT
NEEDLE HYPO 25GX1X1/2 BEV (NEEDLE) ×4 IMPLANT
NS IRRIG 1000ML POUR BTL (IV SOLUTION) ×4 IMPLANT
PACK SURGICAL SETUP 50X90 (CUSTOM PROCEDURE TRAY) ×4 IMPLANT
PAD ARMBOARD 7.5X6 YLW CONV (MISCELLANEOUS) ×4 IMPLANT
PENCIL BUTTON HOLSTER BLD 10FT (ELECTRODE) ×4 IMPLANT
RELOAD STAPLER LINEAR PROX 30 (STAPLE) ×2 IMPLANT
STAPLER RELOAD LINEAR PROX 30 (STAPLE) ×4
STRIP CLOSURE SKIN 1/2X4 (GAUZE/BANDAGES/DRESSINGS) ×3 IMPLANT
SUT ETHIBOND 0 MO6 C/R (SUTURE) ×4 IMPLANT
SUT MNCRL AB 4-0 PS2 18 (SUTURE) ×4 IMPLANT
SUT SILK 2 0 SH (SUTURE) ×4 IMPLANT
SUT SILK 3 0 (SUTURE)
SUT SILK 3-0 18XBRD TIE 12 (SUTURE) IMPLANT
SUT VIC AB 0 CT2 27 (SUTURE) ×4 IMPLANT
SUT VIC AB 2-0 SH 27 (SUTURE) ×2
SUT VIC AB 2-0 SH 27X BRD (SUTURE) ×2 IMPLANT
SUT VIC AB 3-0 SH 27 (SUTURE) ×2
SUT VIC AB 3-0 SH 27XBRD (SUTURE) ×2 IMPLANT
SYR BULB 3OZ (MISCELLANEOUS) ×4 IMPLANT
SYR CONTROL 10ML LL (SYRINGE) ×4 IMPLANT
TOWEL OR 17X24 6PK STRL BLUE (TOWEL DISPOSABLE) ×4 IMPLANT
TOWEL OR 17X26 10 PK STRL BLUE (TOWEL DISPOSABLE) ×4 IMPLANT
TRAY FOLEY CATH SILVER 16FR (SET/KITS/TRAYS/PACK) ×4 IMPLANT

## 2017-12-19 NOTE — Op Note (Addendum)
Operative Note  Nicholas Cunningham  [id number]  [id number]  12/19/2017   Surgeon: Leeroy Bockhelsea A ConnorMD  Assistant: Hedda SladePuja Gosai PA-C  Procedure performed: open repair of chronically incarcerated right indirect inguinal hernia; appendectomy  Preop diagnosis:  right inguinal hernia  Post-op diagnosis/intraop findings: Chronically incarcerated large inguinal scrotal hernia containing small bowel and right colon, elongated appendix containing multiple appendicoliths.  Specimens: Appendix  EBL: 30cc  Complications: none  Description of procedure: After obtaining informed consent and placement of a taps block by Dr. Michelle Piperssey in holding,  the patient was taken to the operating room and placed supine on operating room table wheregeneral anesthesia was initiated, preoperative antibiotics were administered, SCDs applied, and a formal timeout was performed. The groin was clipped, prepped and draped in the usual sterile fashion. The patient's chronically indwelling Foley catheter was left in place. An oblique incision was made in the groin after infiltrating the tissues with local anesthetic. Soft tissues were dissected using electrocautery until the external oblique aponeurosis was encountered. This was divided sharply to expand the external ring. A plane was bluntly developed between the spermatic cord/ hernia sac and the external oblique. The ilioinguinal nerve was identified and divided between hemostats, each end was ligated with a 3-0 Vicryl tie.. The spermatic cord was then bluntly dissected away from the pubic tubercle and encircled with a Penrose. Inspection of the inguinal anatomy revealed a very large indirect inguinal scrotal hernia .  there was hypertrophied cremasteric muscle encasing the hernia sac and this was divided with cautery. The indirect hernia sac was bluntly dissected away from the cord structures. Distally it was extremely adherent within the scrotum and therefore an area was found  that was very thin on the hernia sac and it was opened sharply. There was a large amount of right colon and small bowel incarcerated within the hernia and this was gradually and gently reduced out of the scrotum and into the field. His appendix was extremely long and one palpated containing multiple appendicoliths. I performed an appendectomy using a TA 30 blue load to divide the appendix from the cecum. The appendiceal mesentery was divided with LigaSure and the appendix was handed off the field. I then proceeded to reduce the viscera back into the abdominal cavity. The internal ring had to be extended laterally in order to allow this to happen as the cecum was chronically dilated. Ultimately all the bowel was able to be reduced in the abdomen. The hernia sac was cleared off to the level of the internal ring and clamped with a Kelly clamp. The sac was divided above the level of the Kelly clamp and then a running 2-0 silk suture was used to close the peritoneum at the level of the internal ring. This was reduced into the abdomen. The internal ring was then closed with interrupted 0 Ethibond sutures- 2 figure of 8 laterally and one simple suture medially to re-create a narrowed internal ring. The distal sac was then bluntly separated from the cord structures and ultimately entirely excised. The spermatic cord was inspected and found to be free of injury or active bleeding. The wound was appropriately hemostatic. Because of the incidental appendectomy and the patient's history of chronic indwelling Foley catheter and recent UTI, I elected to use a piece of phasix mesh. This was brought onto the field and trimmed to suit the site. This was tacked to the pubic tubercle fascia using 0 ethibond. Interrupted 0 ethibond was then used to tack the mesh to the  inferior shelving edge. Superiorly the mesh was tacked to the internal oblique using interrupted 0 ethibond. The tails of the mesh were wrapped around the spermatic  cord, ensuring adequate room for the cord,and tacked to each other with 0 ethibond, and then directed laterally to lie flat. Hemostasis was ensured within the wound. The Penrose was removed. The external oblique aponeurosis was reapproximated with a running 3-0 Vicryl to re-create a narrowed external ring. 0.5% lidocaine with epinephrine was infiltrated in the space just below the external oblique and around the pubic tubercle. The Scarpa's was reapproximated with interrupted 2-0 Vicryls. The skin was closed with a running subcuticular Monocryl. The remainder of the local was injected in the subcutaneous and subcuticular space. The field was then cleaned, benzoin and Steri-Strips and sterile bandage were applied. Both testicles were palpated in the scrotum at the end of the case. The patient was then awakened extubated and taken to PACU in stable condition.   All counts were correct at the completion of the case

## 2017-12-19 NOTE — Anesthesia Preprocedure Evaluation (Signed)
Anesthesia Evaluation  Patient identified by MRN, date of birth, ID band Patient awake    Reviewed: Allergy & Precautions, NPO status , Patient's Chart, lab work & pertinent test results  Airway Mallampati: I  TM Distance: >3 FB Neck ROM: Full    Dental   Pulmonary COPD, former smoker,    Pulmonary exam normal        Cardiovascular hypertension, Pt. on medications Normal cardiovascular exam     Neuro/Psych Anxiety    GI/Hepatic   Endo/Other    Renal/GU      Musculoskeletal   Abdominal   Peds  Hematology   Anesthesia Other Findings   Reproductive/Obstetrics                             Anesthesia Physical Anesthesia Plan  ASA: III  Anesthesia Plan: General   Post-op Pain Management:  Regional for Post-op pain   Induction: Intravenous  PONV Risk Score and Plan: 2 and Ondansetron, Midazolam and Dexamethasone  Airway Management Planned: LMA  Additional Equipment:   Intra-op Plan:   Post-operative Plan: Extubation in OR  Informed Consent: I have reviewed the patients History and Physical, chart, labs and discussed the procedure including the risks, benefits and alternatives for the proposed anesthesia with the patient or authorized representative who has indicated his/her understanding and acceptance.     Plan Discussed with: CRNA and Surgeon  Anesthesia Plan Comments:         Anesthesia Quick Evaluation

## 2017-12-19 NOTE — Discharge Instructions (Signed)
HERNIA REPAIR: POST OP INSTRUCTIONS ° °###################################################################### ° °EAT °Gradually transition to a high fiber diet with a fiber supplement over the next few weeks after discharge.  Start with a pureed / full liquid diet (see below) ° °WALK °Walk an hour a day.  Control your pain to do that.   ° °CONTROL PAIN °Control pain so that you can walk, sleep, tolerate sneezing/coughing, go up/down stairs. ° °HAVE A BOWEL MOVEMENT DAILY °Keep your bowels regular to avoid problems.  OK to try a laxative to override constipation.  OK to use an antidairrheal to slow down diarrhea.  Call if not better after 2 tries ° °CALL IF YOU HAVE PROBLEMS/CONCERNS °Call if you are still struggling despite following these instructions. °Call if you have concerns not answered by these instructions ° °###################################################################### ° ° ° °1. DIET: Follow a light bland diet the first 24 hours after arrival home, such as soup, liquids, crackers, etc.  Be sure to include lots of fluids daily.  Avoid fast food or heavy meals as your are more likely to get nauseated.  Eat a low fat the next few days after surgery. °2. Take your usually prescribed home medications unless otherwise directed. °3. PAIN CONTROL: °a. Pain is best controlled by a usual combination of three different methods TOGETHER: °i. Ice/Heat °ii. Over the counter pain medication °iii. Prescription pain medication °b. Most patients will experience some swelling and bruising around the hernia(s) such as the bellybutton, groins, or old incisions.  Ice packs or heating pads (30-60 minutes up to 6 times a day) will help. Use ice for the first few days to help decrease swelling and bruising, then switch to heat to help relax tight/sore spots and speed recovery.  Some people prefer to use ice alone, heat alone, alternating between ice & heat.  Experiment to what works for you.  Swelling and bruising can take  several weeks to resolve.   °c. It is helpful to take an over-the-counter pain medication regularly for the first few weeks.  Choose one of the following that works best for you: °i. Naproxen (Aleve, etc)  Two 220mg tabs twice a day °ii. Ibuprofen (Advil, etc) Three 200mg tabs four times a day (every meal & bedtime) °iii. Acetaminophen (Tylenol, etc) 325-650mg four times a day (every meal & bedtime) °d. A  prescription for pain medication should be given to you upon discharge.  Take your pain medication as prescribed.  °i. If you are having problems/concerns with the prescription medicine (does not control pain, nausea, vomiting, rash, itching, etc), please call us [phone] to see if we need to switch you to a different pain medicine that will work better for you and/or control your side effect better. °ii. If you need a refill on your pain medication, please contact your pharmacy.  They will contact our office to request authorization. Prescriptions will not be filled after 5 pm or on week-ends. °4. Avoid getting constipated.  Between the surgery and the pain medications, it is common to experience some constipation.  Increasing fluid intake and taking a fiber supplement (such as Metamucil, Citrucel, FiberCon, MiraLax, etc) 1-2 times a day regularly will usually help prevent this problem from occurring.  A mild laxative (prune juice, Milk of Magnesia, MiraLax, etc) should be taken according to package directions if there are no bowel movements after 48 hours.   °5. Wash / shower every day.  You may shower over the dressings as they are waterproof.   °6. Remove   your waterproof bandages 3 days after surgery.  Steri strips will peel off after 1-2 weeks. You may leave the incision open to air.  You may replace a dressing/Band-Aid to cover the incision for comfort if you wish.  Continue to shower over incision(s) after the dressing is off.    7. ACTIVITIES as tolerated:   a. You may resume regular (light)  daily activities beginning the next day--such as daily self-care, walking, climbing stairs--gradually increasing activities as tolerated. . b. Refrain from the most intensive and strenuous activity for last such as sit-ups, heavy lifting, contact sports, etc  Refrain from any heavy lifting or straining until 6 weeks after surgery.   c. DO NOT PUSH THROUGH PAIN.  Let pain be your guide: If it hurts to do something, don't do it.  Pain is your body warning you to avoid that activity for another week until the pain goes down. d. You may drive when you are no longer taking prescription pain medication, you can comfortably wear a seatbelt, and you can safely maneuver your car and apply brakes. e. Bonita QuinYou may have sexual intercourse when it is comfortable.  8. FOLLOW UP in our office a. Please call [facility] at [phone] to set up an appointment to see your surgeon in the office for a follow-up appointment approximately 2-3 weeks after your surgery. b. Make sure that you call for this appointment the day you arrive home to insure a convenient appointment time. 9.  IF YOU HAVE DISABILITY OR FAMILY LEAVE FORMS, BRING THEM TO THE OFFICE FOR PROCESSING.  DO NOT GIVE THEM TO YOUR DOCTOR.  WHEN TO CALL US [phone]: 1. Poor pain control 2. Reactions / problems with new medications (rash/itching, nausea, etc)  3. Fever over 101.5 F (38.5 C) 4. Inability to urinate 5. Nausea and/or vomiting 6. Worsening swelling or bruising 7. Continued bleeding from incision. 8. Increased pain, redness, or drainage from the incision   The clinic staff is available to answer your questions during regular business hours (8:30am-5pm).  Please dont hesitate to call and ask to speak to one of our nurses for clinical concerns.   If you have a medical emergency, go to the nearest emergency room or call 911.  A surgeon from [facility] is always on call at the hospitals in [facility]  [facility],  [place] [address], [address], [place], [place]  [address] ?  [address] [address], [place], [place]   [id number] MAIN: [phone] ? TOLL FREE: [phone] ? FAX: [phone] [email or web address]

## 2017-12-19 NOTE — Transfer of Care (Signed)
Immediate Anesthesia Transfer of Care Note  Patient: Nicholas Cunningham  Procedure(s) Performed: OPEN RIGHT INGUINAL HERNIA REPAIR ADULT WITH MESH (Right Groin) INSERTION OF MESH (Right Groin) APPENDECTOMY (N/A Groin)  Patient Location: PACU  Anesthesia Type:General  Level of Consciousness: alert , oriented, drowsy and patient cooperative  Airway & Oxygen Therapy: Patient Spontanous Breathing  Post-op Assessment: Report given to RN, Post -op Vital signs reviewed and stable and Patient moving all extremities X 4  Post vital signs: Reviewed and stable  Last Vitals:  Vitals:   12/19/17 0621 12/19/17 0952  BP: (!) 173/94   Pulse: 76   Resp: 18   Temp: 36.5 C (!) 36.4 C  SpO2: 99%     Last Pain:  Vitals:   12/19/17 0650  TempSrc:   PainSc: 0-No pain         Complications: No apparent anesthesia complications

## 2017-12-19 NOTE — Anesthesia Postprocedure Evaluation (Signed)
Anesthesia Post Note  Patient: Nicholas Cunningham  Procedure(s) Performed: OPEN RIGHT INGUINAL HERNIA REPAIR ADULT WITH MESH (Right Groin) INSERTION OF MESH (Right Groin) APPENDECTOMY (N/A Groin)     Patient location during evaluation: PACU Anesthesia Type: General Level of consciousness: awake and alert Pain management: pain level controlled Vital Signs Assessment: post-procedure vital signs reviewed and stable Respiratory status: spontaneous breathing, nonlabored ventilation, respiratory function stable and patient connected to nasal cannula oxygen Cardiovascular status: blood pressure returned to baseline and stable Postop Assessment: no apparent nausea or vomiting Anesthetic complications: no    Last Vitals:  Vitals:   12/19/17 1120 12/19/17 1121  BP:  121/67  Pulse:  65  Resp:  13  Temp: 36.6 C   SpO2:  95%    Last Pain:  Vitals:   12/19/17 1045  TempSrc:   PainSc: Asleep                 Kobi Aller DAVID

## 2017-12-19 NOTE — OR Nursing (Signed)
Discharge instructions were review with pt and pt wife. Pt was sent home with incentive spirometer and demonstrated correctly how to use it. Pt and family denied any questions regarding discharge teaching. Pain tolerable and denied any n/v.
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
## 2017-12-19 NOTE — Anesthesia Procedure Notes (Signed)
Anesthesia Regional Block: TAP block   Pre-Anesthetic Checklist: ,, timeout performed, Correct Patient, Correct Site, Correct Laterality, Correct Procedure, Correct Position, site marked, Risks and benefits discussed,  Surgical consent,  Pre-op evaluation,  At surgeon's request and post-op pain management  Laterality: Right  Prep: chloraprep       Needles:  Injection technique: Single-shot  Needle Type: Echogenic Stimulator Needle     Needle Length: 9cm  Needle Gauge: 21     Additional Needles:   Narrative:  Start time: 12/19/2017 7:05 AM End time: 12/19/2017 7:15 AM Injection made incrementally with aspirations every 5 mL.  Performed by: Personally  Anesthesiologist: Arta Brucessey, Jozy Mcphearson, MD  Additional Notes: Monitors applied. Patient sedated. Sterile prep and drape,hand hygiene and sterile gloves were used. Relevant anatomy identified.Needle position confirmed.Local anesthetic injected incrementally after negative aspiration. Local anesthetic spread visualized in Transversus Abdominus Plane. Vascular puncture avoided. No complications. Image printed for medical record.The patient tolerated the procedure well.

## 2017-12-19 NOTE — Anesthesia Procedure Notes (Signed)
Procedure Name: Intubation Date/Time: 12/19/2017 7:40 AM Performed by: Julieta Bellini, CRNA Pre-anesthesia Checklist: Patient identified, Emergency Drugs available, Suction available and Patient being monitored Patient Re-evaluated:Patient Re-evaluated prior to induction Oxygen Delivery Method: Circle system utilized Preoxygenation: Pre-oxygenation with 100% oxygen Induction Type: IV induction Ventilation: Oral airway inserted - appropriate to patient size and Mask ventilation without difficulty Laryngoscope Size: Mac and 4 Grade View: Grade II Tube type: Oral Tube size: 7.5 mm Number of attempts: 1 Airway Equipment and Method: Stylet and Oral airway Placement Confirmation: ETT inserted through vocal cords under direct vision,  positive ETCO2 and breath sounds checked- equal and bilateral Secured at: 22 cm Tube secured with: Tape Dental Injury: Teeth and Oropharynx as per pre-operative assessment

## 2017-12-20 ENCOUNTER — Encounter (HOSPITAL_COMMUNITY): Payer: Self-pay | Admitting: Surgery

## 2018-01-23 IMAGING — CT CT ABD-PELV W/ CM
2 of 5 series · 16 of 46 positions shown, 18 images · IV contrast (APPLIED)
Comparison: None.

CLINICAL DATA: 61-year-old male with right inguinal fullness.

EXAM:
CT ABDOMEN AND PELVIS WITH CONTRAST
TECHNIQUE: Multidetector CT imaging of the abdomen and pelvis was performed
using the standard protocol following bolus administration of
intravenous contrast.
CONTRAST:  100mL MC9GBF-H88 IOPAMIDOL (MC9GBF-H88) INJECTION 61%

[Series 4: delay · axial · delayed · 0.85mm/px · z∈[-20,+450]mm · 13 of 110 slices shown, 15 images]
[im 8/110  soft-tissue]
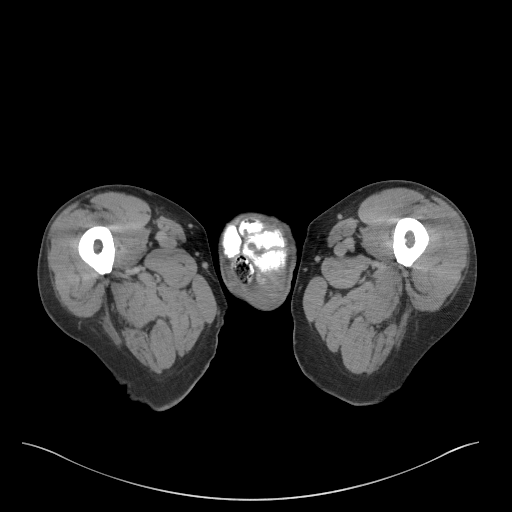
[im 8/110  bone]
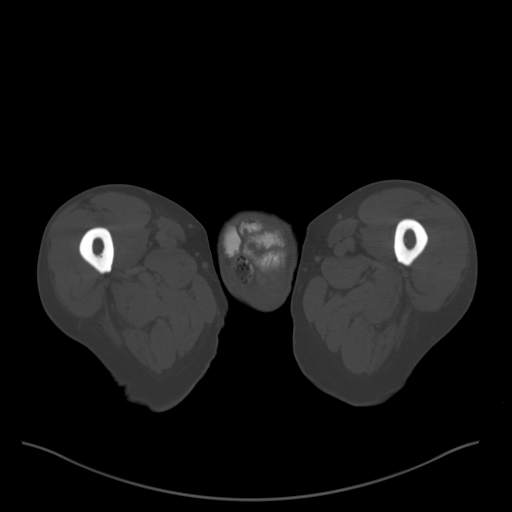
[im 16/110  soft-tissue]
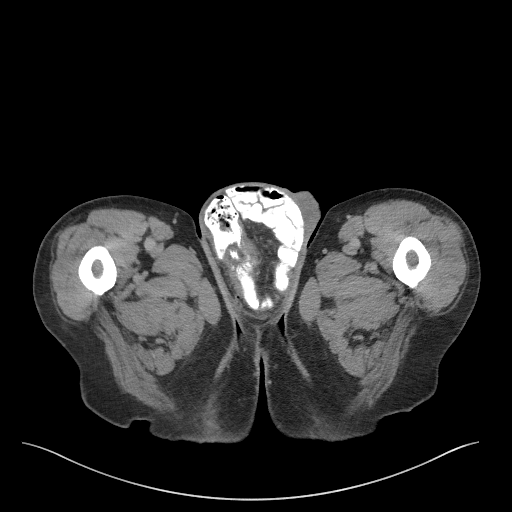
[im 24/110  soft-tissue]
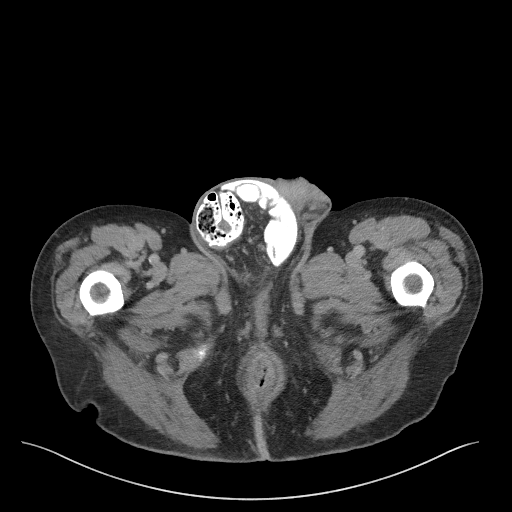
[im 32/110  soft-tissue]
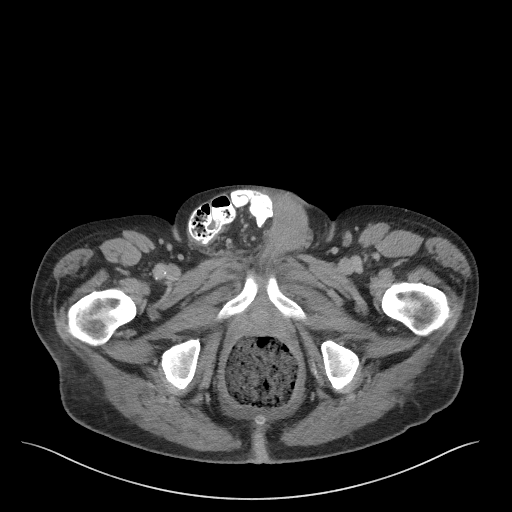
[im 39/110  soft-tissue]
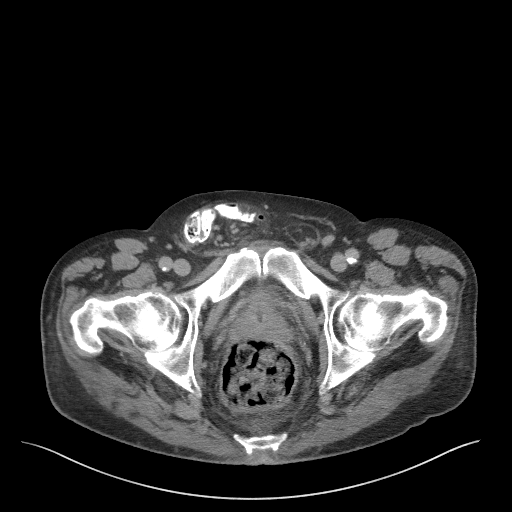
[im 47/110  soft-tissue]
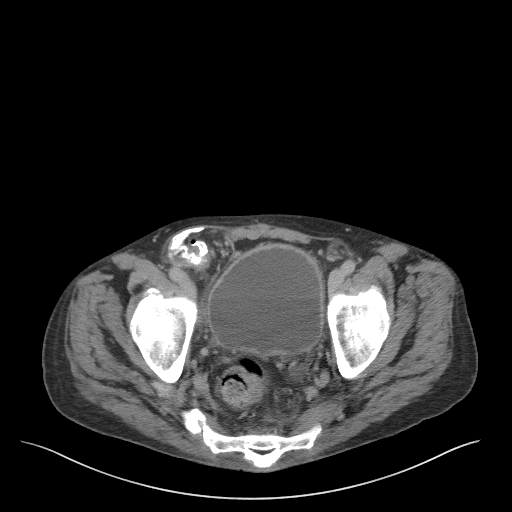
[im 55/110  soft-tissue]
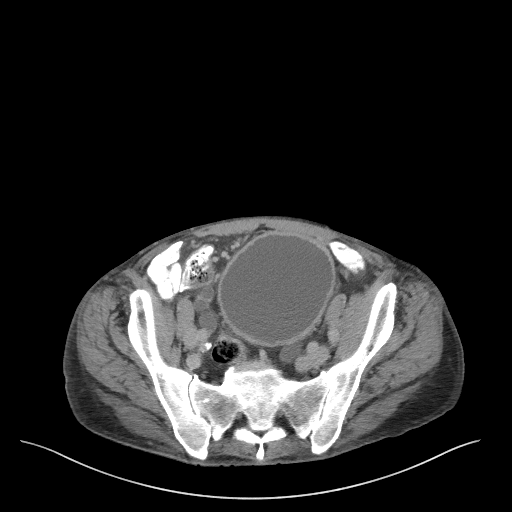
[im 63/110  soft-tissue]
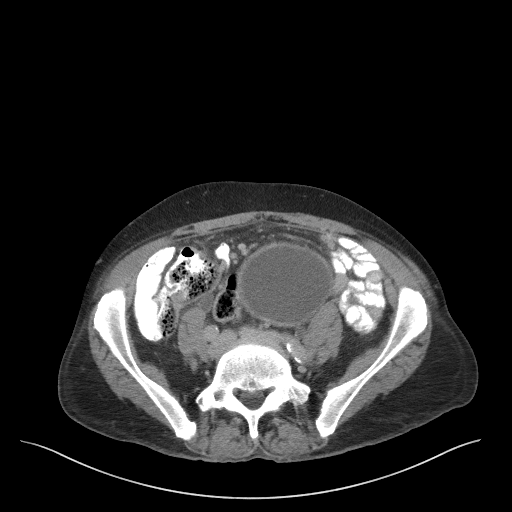
[im 71/110  soft-tissue]
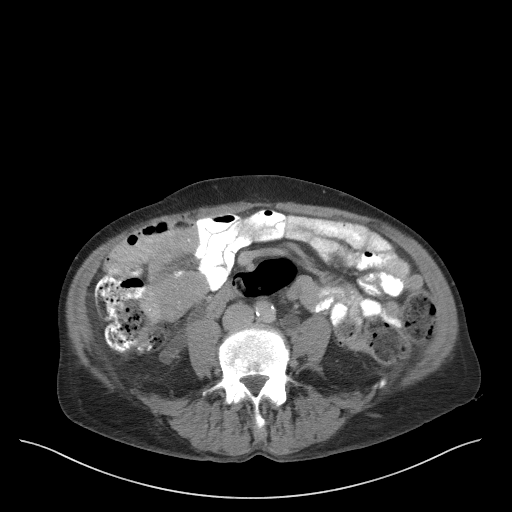
[im 71/110  bone]
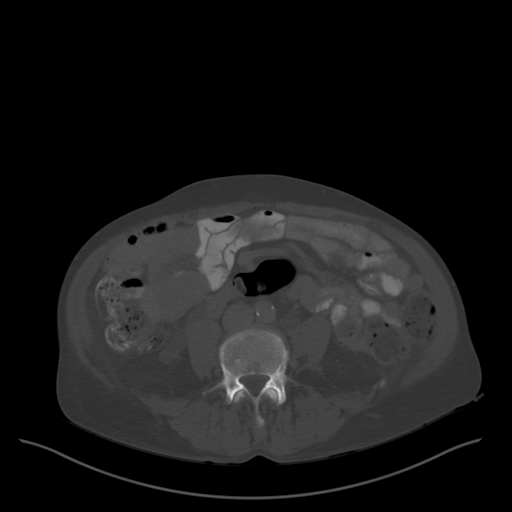
[im 78/110  soft-tissue]
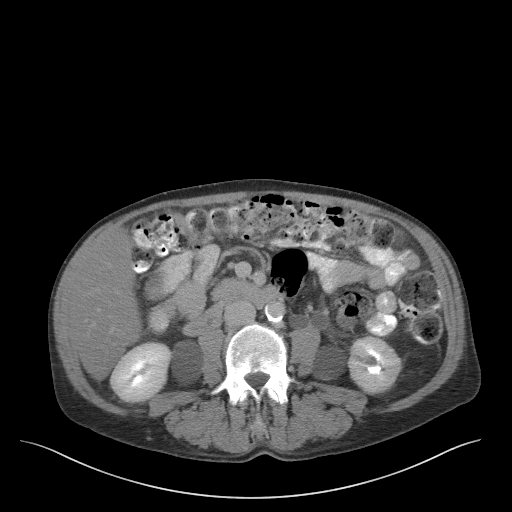
[im 86/110  soft-tissue]
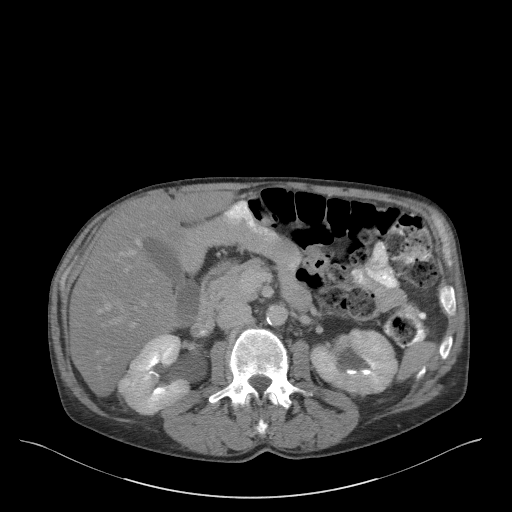
[im 94/110  soft-tissue]
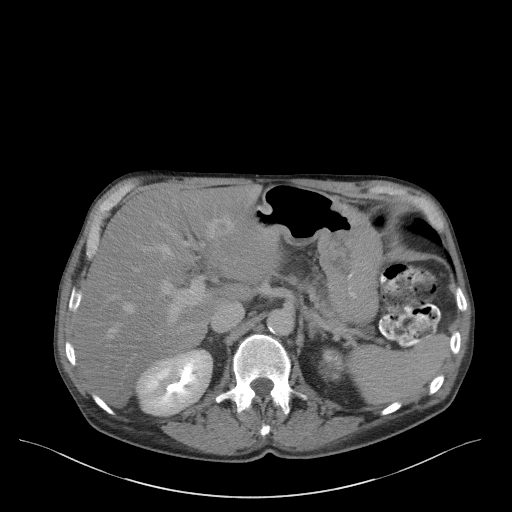
[im 102/110  soft-tissue]
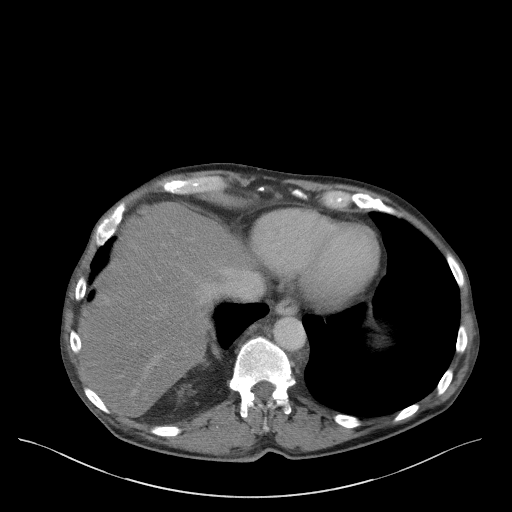

[Series 6: coronal st · coronal · 0.75mm/px · 3 of 89 slices shown]
[im 30/89  soft-tissue]
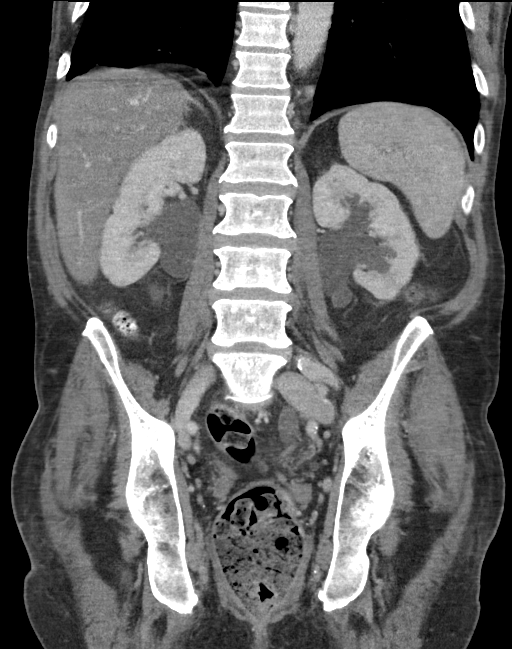
[im 40/89  soft-tissue]
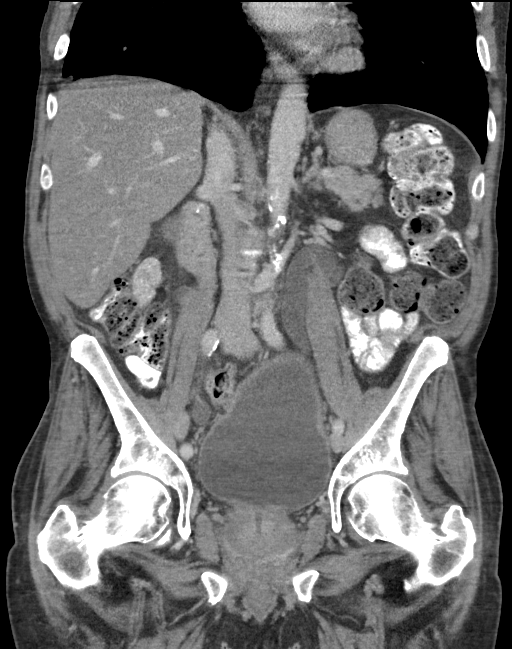
[im 49/89  soft-tissue]
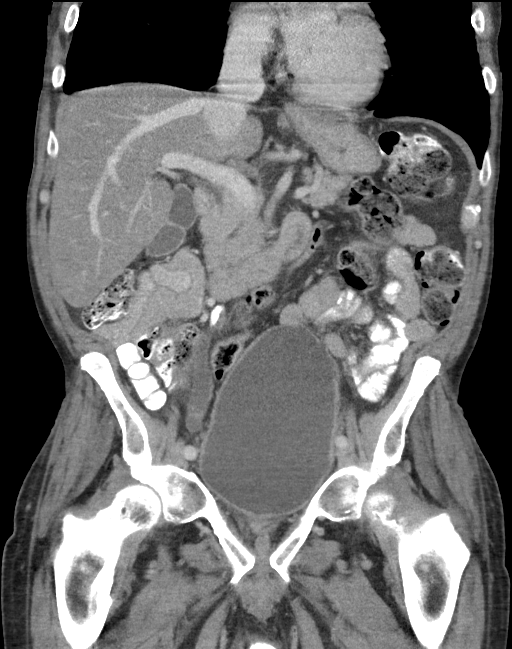

[16 of 46 positions shown; findings below may reference images not displayed]

FINDINGS: Lower chest: No acute abnormality

Hepatobiliary: Probable mild hepatic steatosis noted. No focal
hepatic abnormalities identified. The gallbladder is unremarkable.
No biliary dilatation.

Pancreas: Unremarkable

Spleen: Unremarkable

Adrenals/Urinary Tract: Moderate to severe bilateral
hydroureteronephrosis to the bladder noted without obstructing cause
identified. There is mild circumferential bladder wall thickening.
No other renal abnormalities identified. The adrenal glands are
unremarkable.

Stomach/Bowel: There is no evidence of bowel obstruction, focal
bowel wall thickening or inflammatory changes. The appendix is
normal.

Vascular/Lymphatic: Aortic atherosclerosis. No enlarged abdominal or
pelvic lymph nodes.

Reproductive: Unremarkable

Other: A large right inguinal hernia extending into the scrotum
identified containing distal small bowel and proximal colon. There
is no evidence of bowel obstruction. No ascites, focal collection or
pneumoperitoneum identified.

Musculoskeletal: No acute or suspicious abnormality
IMPRESSION: 1. A large right inguinal hernia extending into the scrotum
containing distal small bowel and proximal colon. No evidence of
bowel obstruction.
2. Moderate to severe bilateral hydroureteronephrosis to the bladder
without obstructing cause identified. Bladder distention and mild
bladder wall thickening suggests chronic obstruction.
3. Probable mild hepatic steatosis
4.  Aortic Atherosclerosis ([id number]).

## 2018-01-25 ENCOUNTER — Telehealth: Payer: Self-pay | Admitting: Urology

## 2018-01-25 NOTE — Telephone Encounter (Signed)
Patient had his hernia surgery on 12-19-17 and is now wanting to have his cath removed that we [place]. Is it ok to take it out and can this be on the nurse schedule?  Marcelino DusterMichelle

## 2018-01-26 NOTE — Telephone Encounter (Signed)
Okay to put on nurse schedule.  He will need a voiding trial and restart intermittent catheterization if not adequately emptying.

## 2018-02-01 ENCOUNTER — Other Ambulatory Visit: Payer: Self-pay | Admitting: Physician Assistant

## 2018-02-01 NOTE — Telephone Encounter (Signed)
Copied from CRM [phone]. Topic: Quick Communication - Rx Refill/Question >> Feb 01, 2018 10:09 AM Herby AbrahamJohnson, Shiquita C wrote: Medication: furosemide (LASIX) 40 MG tablet, atorvastatin (LIPITOR) 10 MG tablet , and also potassium chloride (KLOR-CON) 8 MEQ tablet   Has the patient contacted their pharmacy? No  (Agent: If no, request that the patient contact the pharmacy for the refill.) Preferred Pharmacy (with phone number or street name): [facility] [id number] [place] - [address] [facility] [address] [phone] (Phone) [phone] (Fax)     Agent: Please be advised that RX refills may take up to 3 business days. We ask that you follow-up with your pharmacy.

## 2018-02-01 NOTE — Telephone Encounter (Signed)
Rx request of 2 medications listed as historical: Lipitor and Klor-Con  Lasix 40 mg  LOV: 07/22/17  PCP: Chestine Sporelark  Pharmacy: verified  (Due 6 month f/u)

## 2018-02-02 NOTE — Telephone Encounter (Signed)
LOV  07/22/17 Dr. Neva SeatGreene Provider not on medication list.

## 2018-02-06 ENCOUNTER — Telehealth: Payer: Self-pay | Admitting: Physician Assistant

## 2018-02-06 ENCOUNTER — Ambulatory Visit (INDEPENDENT_AMBULATORY_CARE_PROVIDER_SITE_OTHER): Payer: BLUE CROSS/BLUE SHIELD | Admitting: Family Medicine

## 2018-02-06 DIAGNOSIS — R339 Retention of urine, unspecified: Secondary | ICD-10-CM

## 2018-02-06 NOTE — Telephone Encounter (Signed)
Copied from CRM [id number]. Topic: Quick Communication - Rx Refill/Question >> Feb 06, 2018  1:27 PM Floria RavelingStovall, Shana A wrote: Medication: potassium chloride (KLOR-CON) 8 MEQ tablet [[id number]][[id number]] furosemide (LASIX) 40 MG tablet [[id number]][[id number]]  atorvastatin (LIPITOR) 10 MG tablet [[id number]][[id number]]  Has the patient contacted their pharmacy? No  (Agent: If no, request that the patient contact the pharmacy for the refill.) Preferred Pharmacy (with phone number or street name): [facility] [address] [place]  Agent: Please be advised that RX refills may take up to 3 business days. We ask that you follow-up with your pharmacy.

## 2018-02-06 NOTE — Telephone Encounter (Signed)
Patient forgot to mention that he needed a refill on folic acid (FOLVITE) 1 MG tablet

## 2018-02-06 NOTE — Progress Notes (Signed)
Catheter Removal  Patient is present today for a catheter removal.  10ml of water was drained from the balloon. A 16FR foley cath was removed from the bladder no complications were noted . Patient tolerated well.  Preformed by: Carrie Garrison, CMA    

## 2018-02-07 NOTE — Telephone Encounter (Signed)
Tried to call patient, no answer--LVM for pt to call and get an appointment in order to get his med refills.

## 2018-02-07 NOTE — Telephone Encounter (Signed)
All refills denied. Needs office visit.

## 2018-02-07 NOTE — Telephone Encounter (Signed)
Pt requesting refills on Folic Acid, Potassium Chloride, Furosemide, and Atorvastatin. Medications filled previously by historical provider.   LOV: 07/22/17  PCP: Daleen SnookMichael Clark,PA  [facility] [address]    [place]

## 2018-02-15 ENCOUNTER — Ambulatory Visit: Payer: BLUE CROSS/BLUE SHIELD | Admitting: Urology

## 2018-04-06 ENCOUNTER — Other Ambulatory Visit: Payer: Self-pay | Admitting: Physician Assistant

## 2018-04-07 ENCOUNTER — Other Ambulatory Visit: Payer: Self-pay

## 2018-04-07 MED ORDER — FUROSEMIDE 40 MG PO TABS: 40.0000 mg | ORAL_TABLET | Freq: Two times a day (BID) | ORAL | 0 refills | Status: DC

## 2018-04-07 MED ORDER — FUROSEMIDE 40 MG PO TABS: 40.0000 mg | ORAL_TABLET | Freq: Two times a day (BID) | ORAL | 0 refills | Status: AC

## 2018-04-07 NOTE — Telephone Encounter (Signed)
Please refill for 30 days.  I'd like to see him in that time while he is on his medication. Deliah BostonMichael Clark, MS, PA-C 4:21 PM, 04/07/2018

## 2018-04-07 NOTE — Telephone Encounter (Signed)
Lasix 40 mg refill request  According to notes from April he needs an appt for refills.   No future appts noted.

## 2018-04-07 NOTE — Telephone Encounter (Signed)
Please advise. Dgaddy, CMA
